# Patient Record
Sex: Male | Born: 1990 | Race: White | Hispanic: No | Marital: Single | Smoking: Current every day smoker
Health system: Southern US, Community
[De-identification: ages and names within clinical notes are randomized; demographics above are authoritative.]

## PROBLEM LIST (undated history)

## (undated) DIAGNOSIS — F988 Other specified behavioral and emotional disorders with onset usually occurring in childhood and adolescence: Secondary | ICD-10-CM

## (undated) DIAGNOSIS — K859 Acute pancreatitis without necrosis or infection, unspecified: Secondary | ICD-10-CM

## (undated) DIAGNOSIS — E119 Type 2 diabetes mellitus without complications: Secondary | ICD-10-CM

## (undated) DIAGNOSIS — F319 Bipolar disorder, unspecified: Secondary | ICD-10-CM

## (undated) HISTORY — PX: FINGER SURGERY: SHX640

## (undated) HISTORY — PX: OTHER SURGICAL HISTORY: SHX169

---
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
## 2015-07-02 ENCOUNTER — Emergency Department (HOSPITAL_BASED_OUTPATIENT_CLINIC_OR_DEPARTMENT_OTHER): Payer: Managed Care, Other (non HMO)

## 2015-07-02 ENCOUNTER — Emergency Department (HOSPITAL_BASED_OUTPATIENT_CLINIC_OR_DEPARTMENT_OTHER)
Admission: EM | Admit: 2015-07-02 | Discharge: 2015-07-02 | Disposition: A | Discharge status: Home / Self Care | Payer: Managed Care, Other (non HMO) | Attending: Emergency Medicine | Admitting: Emergency Medicine

## 2015-07-02 ENCOUNTER — Encounter (HOSPITAL_BASED_OUTPATIENT_CLINIC_OR_DEPARTMENT_OTHER): Payer: Self-pay | Admitting: *Deleted

## 2015-07-02 DIAGNOSIS — Y998 Other external cause status: Secondary | ICD-10-CM | POA: Insufficient documentation

## 2015-07-02 DIAGNOSIS — T50905A Adverse effect of unspecified drugs, medicaments and biological substances, initial encounter: Secondary | ICD-10-CM | POA: Insufficient documentation

## 2015-07-02 DIAGNOSIS — R21 Rash and other nonspecific skin eruption: Secondary | ICD-10-CM | POA: Diagnosis not present

## 2015-07-02 DIAGNOSIS — Z79899 Other long term (current) drug therapy: Secondary | ICD-10-CM | POA: Insufficient documentation

## 2015-07-02 DIAGNOSIS — Y9289 Other specified places as the place of occurrence of the external cause: Secondary | ICD-10-CM | POA: Insufficient documentation

## 2015-07-02 DIAGNOSIS — W228XXA Striking against or struck by other objects, initial encounter: Secondary | ICD-10-CM | POA: Insufficient documentation

## 2015-07-02 DIAGNOSIS — Y9389 Activity, other specified: Secondary | ICD-10-CM | POA: Diagnosis not present

## 2015-07-02 DIAGNOSIS — R739 Hyperglycemia, unspecified: Secondary | ICD-10-CM

## 2015-07-02 DIAGNOSIS — F1310 Sedative, hypnotic or anxiolytic abuse, uncomplicated: Secondary | ICD-10-CM | POA: Diagnosis not present

## 2015-07-02 DIAGNOSIS — E1165 Type 2 diabetes mellitus with hyperglycemia: Secondary | ICD-10-CM | POA: Insufficient documentation

## 2015-07-02 DIAGNOSIS — F17210 Nicotine dependence, cigarettes, uncomplicated: Secondary | ICD-10-CM | POA: Diagnosis not present

## 2015-07-02 DIAGNOSIS — F909 Attention-deficit hyperactivity disorder, unspecified type: Secondary | ICD-10-CM | POA: Insufficient documentation

## 2015-07-02 DIAGNOSIS — F1210 Cannabis abuse, uncomplicated: Secondary | ICD-10-CM | POA: Diagnosis not present

## 2015-07-02 DIAGNOSIS — Z7984 Long term (current) use of oral hypoglycemic drugs: Secondary | ICD-10-CM | POA: Insufficient documentation

## 2015-07-02 DIAGNOSIS — W231XXA Caught, crushed, jammed, or pinched between stationary objects, initial encounter: Secondary | ICD-10-CM | POA: Diagnosis not present

## 2015-07-02 DIAGNOSIS — S6291XA Unspecified fracture of right wrist and hand, initial encounter for closed fracture: Secondary | ICD-10-CM

## 2015-07-02 DIAGNOSIS — S99921A Unspecified injury of right foot, initial encounter: Secondary | ICD-10-CM | POA: Diagnosis not present

## 2015-07-02 DIAGNOSIS — S62346A Nondisplaced fracture of base of fifth metacarpal bone, right hand, initial encounter for closed fracture: Secondary | ICD-10-CM | POA: Diagnosis not present

## 2015-07-02 DIAGNOSIS — Z8659 Personal history of other mental and behavioral disorders: Secondary | ICD-10-CM | POA: Insufficient documentation

## 2015-07-02 DIAGNOSIS — S6991XA Unspecified injury of right wrist, hand and finger(s), initial encounter: Secondary | ICD-10-CM | POA: Diagnosis present

## 2015-07-02 HISTORY — DX: Type 2 diabetes mellitus without complications: E11.9

## 2015-07-02 LAB — CBC WITH DIFFERENTIAL/PLATELET
BASOS ABS: 0.1 10*3/uL (ref 0.0–0.1)
BASOS PCT: 1 %
Eosinophils Absolute: 0.1 10*3/uL (ref 0.0–0.7)
Eosinophils Relative: 1 %
HEMATOCRIT: 47.8 % (ref 39.0–52.0)
HEMOGLOBIN: 17 g/dL (ref 13.0–17.0)
Lymphocytes Relative: 23 %
Lymphs Abs: 2.8 10*3/uL (ref 0.7–4.0)
MCH: 30.8 pg (ref 26.0–34.0)
MCHC: 35.6 g/dL (ref 30.0–36.0)
MCV: 86.6 fL (ref 78.0–100.0)
Monocytes Absolute: 1.1 10*3/uL — ABNORMAL HIGH (ref 0.1–1.0)
Monocytes Relative: 9 %
NEUTROS ABS: 8.4 10*3/uL — AB (ref 1.7–7.7)
NEUTROS PCT: 66 %
PLATELETS: 360 10*3/uL (ref 150–400)
RBC: 5.52 MIL/uL (ref 4.22–5.81)
RDW: 12.6 % (ref 11.5–15.5)
WBC: 12.5 10*3/uL — AB (ref 4.0–10.5)

## 2015-07-02 LAB — BASIC METABOLIC PANEL
ANION GAP: 14 (ref 5–15)
BUN: 9 mg/dL (ref 6–20)
CALCIUM: 9.3 mg/dL (ref 8.9–10.3)
CO2: 21 mmol/L — ABNORMAL LOW (ref 22–32)
Chloride: 106 mmol/L (ref 101–111)
Creatinine, Ser: 0.68 mg/dL (ref 0.61–1.24)
Glucose, Bld: 245 mg/dL — ABNORMAL HIGH (ref 65–99)
Potassium: 3.9 mmol/L (ref 3.5–5.1)
Sodium: 141 mmol/L (ref 135–145)

## 2015-07-02 LAB — URINE MICROSCOPIC-ADD ON

## 2015-07-02 LAB — URINALYSIS, ROUTINE W REFLEX MICROSCOPIC
BILIRUBIN URINE: NEGATIVE
Glucose, UA: >1000 mg/dL — AB
Ketones, ur: NEGATIVE mg/dL
Leukocytes, UA: NEGATIVE
Nitrite: NEGATIVE
PROTEIN: 100 mg/dL — AB
Specific Gravity, Urine: 1.016 (ref 1.005–1.030)
pH: 6 (ref 5.0–8.0)

## 2015-07-02 LAB — RAPID URINE DRUG SCREEN, HOSP PERFORMED
AMPHETAMINES: POSITIVE — AB
Barbiturates: NOT DETECTED
Benzodiazepines: NOT DETECTED
Cocaine: NOT DETECTED
OPIATES: NOT DETECTED
Tetrahydrocannabinol: POSITIVE — AB

## 2015-07-02 LAB — CBG MONITORING, ED: Glucose-Capillary: 160 mg/dL — ABNORMAL HIGH (ref 65–99)

## 2015-07-02 MED ORDER — HYDROCODONE-ACETAMINOPHEN 5-325 MG PO TABS: 2.0000 | ORAL_TABLET | ORAL | Status: DC | PRN

## 2015-07-02 MED ORDER — SODIUM CHLORIDE 0.9 % IV BOLUS (SEPSIS)
1000.0000 mL | Freq: Once | INTRAVENOUS | Status: AC
  Administered 2015-07-02: 1000 mL via INTRAVENOUS

## 2015-07-02 MED ORDER — ONDANSETRON HCL 4 MG/2ML IJ SOLN
4.0000 mg | Freq: Once | INTRAMUSCULAR | Status: AC
  Administered 2015-07-02: 4 mg via INTRAVENOUS

## 2015-07-02 MED ORDER — ONDANSETRON HCL 4 MG/2ML IJ SOLN
INTRAMUSCULAR | Status: AC
  Administered 2015-07-02: 4 mg via INTRAVENOUS
  Filled 2015-07-02: qty 2

## 2015-07-02 MED ORDER — FENTANYL CITRATE (PF) 100 MCG/2ML IJ SOLN
50.0000 ug | Freq: Once | INTRAMUSCULAR | Status: AC
  Administered 2015-07-02: 50 ug via INTRAVENOUS
  Filled 2015-07-02: qty 2

## 2015-07-02 NOTE — ED Notes (Signed)
Dr. Beaton in room with patient now. 
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
## 2015-07-02 NOTE — Discharge Instructions (Signed)
Hyperglycemia °Hyperglycemia occurs when the glucose (sugar) in your blood is too high. Hyperglycemia can happen for many reasons, but it most often happens to people who do not know they have diabetes or are not managing their diabetes properly.  °CAUSES  °Whether you have diabetes or not, there are other causes of hyperglycemia. Hyperglycemia can occur when you have diabetes, but it can also occur in other situations that you might not be as aware of, such as: °Diabetes °· If you have diabetes and are having problems controlling your blood glucose, hyperglycemia could occur because of some of the following reasons: °¨ Not following your meal plan. °¨ Not taking your diabetes medications or not taking it properly. °¨ Exercising less or doing less activity than you normally do. °¨ Being sick. °Pre-diabetes °· This cannot be ignored. Before people develop Type 2 diabetes, they almost always have "pre-diabetes." This is when your blood glucose levels are higher than normal, but not yet high enough to be diagnosed as diabetes. Research has shown that some long-term damage to the body, especially the heart and circulatory system, may already be occurring during pre-diabetes. If you take action to manage your blood glucose when you have pre-diabetes, you may delay or prevent Type 2 diabetes from developing. °Stress °· If you have diabetes, you may be "diet" controlled or on oral medications or insulin to control your diabetes. However, you may find that your blood glucose is higher than usual in the hospital whether you have diabetes or not. This is often referred to as "stress hyperglycemia." Stress can elevate your blood glucose. This happens because of hormones put out by the body during times of stress. If stress has been the cause of your high blood glucose, it can be followed regularly by your caregiver. That way he/she can make sure your hyperglycemia does not continue to get worse or progress to  diabetes. °Steroids °· Steroids are medications that act on the infection fighting system (immune system) to block inflammation or infection. One side effect can be a rise in blood glucose. Most people can produce enough extra insulin to allow for this rise, but for those who cannot, steroids make blood glucose levels go even higher. It is not unusual for steroid treatments to "uncover" diabetes that is developing. It is not always possible to determine if the hyperglycemia will go away after the steroids are stopped. A special blood test called an A1c is sometimes done to determine if your blood glucose was elevated before the steroids were started. °SYMPTOMS °· Thirsty. °· Frequent urination. °· Dry mouth. °· Blurred vision. °· Tired or fatigue. °· Weakness. °· Sleepy. °· Tingling in feet or leg. °DIAGNOSIS  °Diagnosis is made by monitoring blood glucose in one or all of the following ways: °· A1c test. This is a chemical found in your blood. °· Fingerstick blood glucose monitoring. °· Laboratory results. °TREATMENT  °First, knowing the cause of the hyperglycemia is important before the hyperglycemia can be treated. Treatment may include, but is not be limited to: °· Education. °· Change or adjustment in medications. °· Change or adjustment in meal plan. °· Treatment for an illness, infection, etc. °· More frequent blood glucose monitoring. °· Change in exercise plan. °· Decreasing or stopping steroids. °· Lifestyle changes. °HOME CARE INSTRUCTIONS  °· Test your blood glucose as directed. °· Exercise regularly. Your caregiver will give you instructions about exercise. Pre-diabetes or diabetes which comes on with stress is helped by exercising. °· Eat wholesome,   balanced meals. Eat often and at regular, fixed times. Your caregiver or nutritionist will give you a meal plan to guide your sugar intake.  Being at an ideal weight is important. If needed, losing as little as 10 to 15 pounds may help improve blood  glucose levels. SEEK MEDICAL CARE IF:   You have questions about medicine, activity, or diet.  You continue to have symptoms (problems such as increased thirst, urination, or weight gain). SEEK IMMEDIATE MEDICAL CARE IF:   You are vomiting or have diarrhea.  Your breath smells fruity.  You are breathing faster or slower.  You are very sleepy or incoherent.  You have numbness, tingling, or pain in your feet or hands.  You have chest pain.  Your symptoms get worse even though you have been following your caregiver's orders.  If you have any other questions or concerns.   This information is not intended to replace advice given to you by your health care provider. Make sure you discuss any questions you have with your health care provider.   Document Released: 09/19/2000 Document Revised: 06/18/2011 Document Reviewed: 11/30/2014 Elsevier Interactive Patient Education 2016 [employer]  Cast or Splint Care Casts and splints support injured limbs and keep bones from moving while they heal.  HOME CARE  Keep the cast or splint uncovered during the drying period.  A plaster cast can take 24 to 48 hours to dry.  A fiberglass cast will dry in less than 1 hour.  Do not rest the cast on anything harder than a pillow for 24 hours.  Do not put weight on your injured limb. Do not put pressure on the cast. Wait for your doctor's approval.  Keep the cast or splint dry.  Cover the cast or splint with a plastic bag during baths or wet weather.  If you have a cast over your chest and belly (trunk), take sponge baths until the cast is taken off.  If your cast gets wet, dry it with a towel or blow dryer. Use the cool setting on the blow dryer.  Keep your cast or splint clean. Wash a dirty cast with a damp cloth.  Do not put any objects under your cast or splint.  Do not scratch the skin under the cast with an object. If itching is a problem, use a blow dryer on a cool setting over  the itchy area.  Do not trim or cut your cast.  Do not take out the padding from inside your cast.  Exercise your joints near the cast as told by your doctor.  Raise (elevate) your injured limb on 1 or 2 pillows for the first 1 to 3 days. GET HELP IF:  Your cast or splint cracks.  Your cast or splint is too tight or too loose.  You itch badly under the cast.  Your cast gets wet or has a soft spot.  You have a bad smell coming from the cast.  You get an object stuck under the cast.  Your skin around the cast becomes red or sore.  You have new or more pain after the cast is put on. GET HELP RIGHT AWAY IF:  You have fluid leaking through the cast.  You cannot move your fingers or toes.  Your fingers or toes turn blue or white or are cool, painful, or puffy (swollen).  You have tingling or lose feeling (numbness) around the injured area.  You have bad pain or pressure under the cast.  You have trouble breathing or have shortness of breath.  You have chest pain.   This information is not intended to replace advice given to you by your health care provider. Make sure you discuss any questions you have with your health care provider.   Document Released: 07/26/2010 Document Revised: 11/26/2012 Document Reviewed: 10/02/2012 Elsevier Interactive Patient Education [employer]

## 2015-07-02 NOTE — ED Notes (Signed)
Splint removed per RN Horton's orders.
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
## 2015-07-02 NOTE — ED Notes (Signed)
[facility] and family about results and plan. Pt alert, NAD, calm, interactive.

## 2015-07-02 NOTE — ED Notes (Signed)
CMS intact before and after. Pt understood and tolerated the procedure well. Pt and pt's mother stated they did not have any questions.

## 2015-07-07 ENCOUNTER — Ambulatory Visit (INDEPENDENT_AMBULATORY_CARE_PROVIDER_SITE_OTHER): Payer: Managed Care, Other (non HMO) | Admitting: Family Medicine

## 2015-07-07 ENCOUNTER — Encounter: Payer: Self-pay | Admitting: Family Medicine

## 2015-07-07 VITALS — BP 131/87 | HR 87 | Ht 70.0 in | Wt 180.0 lb

## 2015-07-07 DIAGNOSIS — S62306A Unspecified fracture of fifth metacarpal bone, right hand, initial encounter for closed fracture: Secondary | ICD-10-CM | POA: Diagnosis not present

## 2015-07-07 MED ORDER — OXYCODONE-ACETAMINOPHEN 7.5-325 MG PO TABS: 1.0000 | ORAL_TABLET | Freq: Four times a day (QID) | ORAL | Status: DC | PRN
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
## 2015-07-07 NOTE — Patient Instructions (Signed)
You have a fracture at the base of your 5th metacarpal (pinky finger). These are at high risk of not healing properly and often require surgery. Wear the splint at all times. Take percocet as needed for severe pain. Elevate above the level of your heart as much as possible. We are referring you to a hand surgeon.

## 2015-07-07 NOTE — Assessment & Plan Note (Signed)
intraarticular at base of 5th metacarpal.  Independently reviewed these radiographs.  We discussed unstable nature of these fractures especially with this being intraarticular and high incidence of not healing without surgical intervention.  Recommending moving ahead with referral to hand surgery - will see them tomorrow.  I recommended ulnar gutter splint - patient did not want this splint - same one he was placed in initially and was very painful.  Opted instead for dorsal and volar splints which were placed today.  Elevation, percocet as needed.

## 2015-07-08 NOTE — Addendum Note (Signed)
Addended by: Kathi SimpersWISE, Khaidyn Staebell F on: 07/08/2015 08:07 AM   Modules accepted: Orders

## 2015-10-18 ENCOUNTER — Encounter: Payer: Self-pay | Admitting: Internal Medicine

## 2015-12-28 ENCOUNTER — Encounter: Payer: Self-pay | Admitting: Endocrinology

## 2016-02-13 ENCOUNTER — Ambulatory Visit (INDEPENDENT_AMBULATORY_CARE_PROVIDER_SITE_OTHER): Payer: Managed Care, Other (non HMO) | Admitting: Endocrinology

## 2016-02-13 ENCOUNTER — Encounter: Payer: Self-pay | Admitting: Endocrinology

## 2016-02-13 DIAGNOSIS — E119 Type 2 diabetes mellitus without complications: Secondary | ICD-10-CM

## 2016-02-13 MED ORDER — NICOTINE 21 MG/24HR TD PT24: 21.0000 mg | MEDICATED_PATCH | Freq: Every day | TRANSDERMAL | 11 refills | Status: AC

## 2016-02-13 MED ORDER — INSULIN ASPART 100 UNIT/ML FLEXPEN: 12.0000 [IU] | PEN_INJECTOR | Freq: Three times a day (TID) | SUBCUTANEOUS | 11 refills | Status: DC

## 2016-02-13 MED ORDER — INSULIN DEGLUDEC 100 UNIT/ML ~~LOC~~ SOPN: 32.0000 [IU] | PEN_INJECTOR | Freq: Every day | SUBCUTANEOUS | 11 refills | Status: DC

## 2016-02-13 NOTE — Patient Instructions (Addendum)
good diet and exercise significantly improve the control of your diabetes.  please let me know if you wish to be referred to a dietician.  high blood sugar is very risky to your health.  you should see an eye doctor and dentist every year.  It is very important to get all recommended vaccinations.  Controlling your blood pressure and cholesterol drastically reduces the damage diabetes does to your body.  Those who smoke should quit.  Please discuss these with your doctor.  Consuming alcohol and drugs works against your efforts to keep your blood sugar is a good range.  check your blood sugar 4 times a day: before the 3 meals, and at bedtime.  also check if you have symptoms of your blood sugar being too high or too low.  please keep a record of the readings and bring it to your next appointment here (or you can bring the meter itself).  You can write it on any piece of paper.  please call us sooner if your blood sugar goes below 70, or if you have a lot of readings over 200.   For now, please:  Decrease the tresiba to 32 units daily, and:  Increase the novolog to 12 units 3 times a day (just before each meal), no matter what your blood sugar is. Please come back for a follow-up appointment in 1 month.

## 2016-02-13 NOTE — Progress Notes (Signed)
Subjective:    Patient ID: [name] Wall, male    DOB: [birth year], 25 y.o.   MRN: [id number]  HPI pt states DM was dx'ed in 2015; he has mild if any neuropathy of the lower extremities; he is unaware of any associated chronic complications; he has been on insulin since soon after dx; pt says his diet and exercise are "fair."  he had an episode of pancreatitis in mid-2017.  rx of DM has been limited by noncompliance, alcoholism, and marijuana use.  Last episode of severe hypoglycemia was in early 2017.  Only episode of DKA was at time of dx.   He takes tresiba (which he seldom misses), and PRN novolog (usually TID--averages 8-12 units per meal).  He says cbg's vary widely.  He has mild hypoglycemia approx once per week, but most cbg's are over 200.  It is in general higher as the day goes on.  He works at [employer], variable shifts.   Past Medical History:  Diagnosis Date  . ADD (attention deficit disorder)   . Bipolar disorder (HCC)   . Diabetes mellitus without complication [facility](HCC)     Past Surgical History:  Procedure Laterality Date  . Arm surgery    . FINGER SURGERY      Social History   Social History  . Marital status: Single    Spouse name: N/A  . Number of children: N/A  . Years of education: N/A   Occupational History  . Not on file.   Social History Main Topics  . Smoking status: Current Every Day Smoker    Packs/day: 1.00    Types: Cigarettes  . Smokeless tobacco: Former NeurosurgeonUser  . Alcohol use 0.0 oz/week     Comment: case of beer/week  . Drug use: No  . Sexual activity: Not on file   Other Topics Concern  . Not on file   Social History Narrative  . No narrative on file    Current Outpatient Prescriptions on File Prior to Visit  Medication Sig Dispense Refill  . amphetamine-dextroamphetamine (ADDERALL) 30 MG tablet Take 30 mg by mouth 2 (two) times daily.       No current facility-administered medications on file prior to visit.     No Known  Allergies  Family History  Problem Relation Age of Onset  . Diabetes Father     BP 132/80   Pulse (!) 104   Ht 5\' 10"  (1.778 m)   Wt 193 lb (87.5 kg)   SpO2 97%   BMI 27.69 kg/m     Review of Systems denies weight loss, blurry vision, headache, chest pain, sob, n/v, muscle cramps, excessive diaphoresis, cold intolerance, rhinorrhea, and easy bruising.  He has frequent urination and depression.      Objective:   Physical Exam VS: see vs page GEN: no distress.  HEAD: head: no deformity eyes: no periorbital swelling, no proptosis external nose and ears are normal mouth: no lesion seen NECK: supple, thyroid is not enlarged.   CHEST WALL: no deformity LUNGS: clear to auscultation CV: reg rate and rhythm, no murmur ABD: abdomen is soft, nontender.  no hepatosplenomegaly.  not distended.  no hernia MUSCULOSKELETAL: muscle bulk and strength are grossly normal.  no obvious joint swelling.  gait is normal and steady.   EXTEMITIES: no deformity.  no ulcer on the feet.  feet are of normal color and temp.  no edema PULSES: dorsalis pedis intact bilat.  no carotid bruit NEURO:  cn 2-12 grossly  intact.   readily moves all 4's.  sensation is intact to touch on the feet SKIN:  Normal texture and temperature.  No rash or suspicious lesion is visible.   NODES:  None palpable at the neck PSYCH: alert, well-oriented.  Does not appear anxious nor depressed.   A1c=9.1% (pt's mother says it was the same last month)  I have reviewed outside records, and summarized: Pt was seen in ER for hand fracture, and acute alcoholism.   He was also seen in ER for acute pancreatitis.       Assessment & Plan:  Type 1 DM: he needs increased rx.   Bipolar disorder, new to me: we discussed the fact that it is essential to control this in order to achieve good glycemic control. Mother says he can't go to psychiatry, due to $3000 deductible.  Patient is advised the following: Patient Instructions  good  diet and exercise significantly improve the control of your diabetes.  please let me know if you wish to be referred to a dietician.  high blood sugar is very risky to your health.  you should see an eye doctor and dentist every year.  It is very important to get all recommended vaccinations.  Controlling your blood pressure and cholesterol drastically reduces the damage diabetes does to your body.  Those who smoke should quit.  Please discuss these with your doctor.  Consuming alcohol and drugs works against your efforts to keep your blood sugar is a good range.  check your blood sugar 4 times a day: before the 3 meals, and at bedtime.  also check if you have symptoms of your blood sugar being too high or too low.  please keep a record of the readings and bring it to your next appointment here (or you can bring the meter itself).  You can write it on any piece of paper.  please call us sooner if your blood sugar goes below 70, or if you have a lot of readings over 200.   For now, please:  Decrease the tresiba to 32 units daily, and:  Increase the novolog to 12 units 3 times a day (just before each meal), no matter what your blood sugar is. Please come back for a follow-up appointment in 1 month.

## 2016-02-14 ENCOUNTER — Encounter: Payer: Self-pay | Admitting: Endocrinology

## 2016-02-14 DIAGNOSIS — E119 Type 2 diabetes mellitus without complications: Secondary | ICD-10-CM | POA: Insufficient documentation

## 2016-03-14 ENCOUNTER — Ambulatory Visit (INDEPENDENT_AMBULATORY_CARE_PROVIDER_SITE_OTHER): Payer: Managed Care, Other (non HMO) | Admitting: Endocrinology

## 2016-03-14 ENCOUNTER — Encounter: Payer: Self-pay | Admitting: Endocrinology

## 2016-03-14 VITALS — BP 123/90 | HR 70 | Wt 198.0 lb

## 2016-03-14 DIAGNOSIS — E119 Type 2 diabetes mellitus without complications: Secondary | ICD-10-CM | POA: Diagnosis not present

## 2016-03-14 LAB — POCT GLYCOSYLATED HEMOGLOBIN (HGB A1C): Hemoglobin A1C: 10.1

## 2016-03-14 MED ORDER — INSULIN DEGLUDEC 100 UNIT/ML ~~LOC~~ SOPN: 50.0000 [IU] | PEN_INJECTOR | Freq: Every day | SUBCUTANEOUS | 11 refills | Status: DC

## 2016-03-14 MED ORDER — INSULIN ASPART 100 UNIT/ML FLEXPEN: 6.0000 [IU] | PEN_INJECTOR | Freq: Three times a day (TID) | SUBCUTANEOUS | 11 refills | Status: DC

## 2016-03-14 NOTE — Progress Notes (Signed)
Subjective:    Patient ID: [name] GianottiMatthew R Kirchner, male    DOB: 12-05-1990, 25 y.o.   MRN: [id number]  HPI Pt returns for f/u of diabetes mellitus:  DM type: 1 Dx'ed: 2015 Complications: none Therapy: insulin since  DKA: only at time of rx Severe hypoglycemia: last episode was early 2017.   Pancreatitis: never Other: rx has been limited by noncompliance, alcoholism, marijuana use, and bipolar disorder (pt says he cannot afford psychiatry copay); he works at [employer], variable shifts; he takes multiple daily injections.  Interval history: no cbg record, but states cbg's vary widely (80-500).  There is no trend throughout the day.  He says he never misses the insulin.   Past Medical History:  Diagnosis Date  . ADD (attention deficit disorder)   . Bipolar disorder (HCC)   . Diabetes mellitus without complication [facility](HCC)     Past Surgical History:  Procedure Laterality Date  . Arm surgery    . FINGER SURGERY      Social History   Social History  . Marital status: Single    Spouse name: N/A  . Number of children: N/A  . Years of education: N/A   Occupational History  . Not on file.   Social History Main Topics  . Smoking status: Current Every Day Smoker    Packs/day: 1.00    Types: Cigarettes  . Smokeless tobacco: Former NeurosurgeonUser  . Alcohol use 0.0 oz/week     Comment: case of beer/week  . Drug use: No  . Sexual activity: Not on file   Other Topics Concern  . Not on file   Social History Narrative  . No narrative on file    Current Outpatient Prescriptions on File Prior to Visit  Medication Sig Dispense Refill  . amphetamine-dextroamphetamine (ADDERALL) 30 MG tablet Take 30 mg by mouth 2 (two) times daily.      . nicotine (NICODERM CQ - DOSED IN MG/24 HOURS) 21 mg/24hr patch Place 1 patch (21 mg total) onto the skin daily. 28 patch 11  . sertraline (ZOLOFT) 100 MG tablet Take 100 mg by mouth daily.     No current facility-administered medications on file prior to  visit.     No Known Allergies  Family History  Problem Relation Age of Onset  . Diabetes Father     BP 123/90   Pulse 70   Wt 198 lb (89.8 kg)   BMI 28.41 kg/m    Review of Systems He denies hypoglycemia.      Objective:   Physical Exam VITAL SIGNS:  See vs page GENERAL: no distress Pulses: dorsalis pedis intact bilat.   MSK: no deformity of the feet CV: no leg edema.  Skin:  no ulcer on the feet.  normal color and temp on the feet. Neuro: sensation is intact to touch on the feet.     A1c=10.1%    Assessment & Plan:  Type 1 DM, worse.    Patient is advised the following: Patient Instructions  Please change your insulin to the numbers listed below. check your blood sugar 4 times a day: before the 3 meals, and at bedtime.  also check if you have symptoms of your blood sugar being too high or too low.  please keep a record of the readings and bring it to your next appointment here (or you can bring the meter itself).  You can write it on any piece of paper.  please call us sooner if your blood sugar goes  below 70, or if you have a lot of readings over 200.   Please come back for a follow-up appointment in 2 months.

## 2016-03-14 NOTE — Patient Instructions (Addendum)
Please change your insulin to the numbers listed below.  check your blood sugar 4 times a day: before the 3 meals, and at bedtime.  also check if you have symptoms of your blood sugar being too high or too low.  please keep a record of the readings and bring it to your next appointment here (or you can bring the meter itself).  You can write it on any piece of paper.  please call us sooner if your blood sugar goes below 70, or if you have a lot of readings over 200.   Please come back for a follow-up appointment in 2 months.

## 2016-04-05 ENCOUNTER — Telehealth: Payer: Self-pay | Admitting: Endocrinology

## 2016-04-05 NOTE — Telephone Encounter (Signed)
°  Refill of   insulin degludec (TRESIBA FLEXTOUCH) 100 UNIT/ML SOPN FlexTouch Pen  Insurance will no longer cover the insulin aspart (NOVOLOG FLEXPEN) 100 UNIT/ML FlexPen  Please advise

## 2016-05-15 ENCOUNTER — Ambulatory Visit: Payer: Managed Care, Other (non HMO) | Admitting: Endocrinology

## 2016-06-29 ENCOUNTER — Emergency Department (HOSPITAL_COMMUNITY): Payer: 59

## 2016-06-29 ENCOUNTER — Encounter (HOSPITAL_COMMUNITY): Payer: Self-pay | Admitting: Emergency Medicine

## 2016-06-29 ENCOUNTER — Emergency Department (HOSPITAL_COMMUNITY)
Admission: EM | Admit: 2016-06-29 | Discharge: 2016-06-30 | Disposition: A | Discharge status: Home / Self Care | Payer: 59 | Attending: Emergency Medicine | Admitting: Emergency Medicine

## 2016-06-29 DIAGNOSIS — Y999 Unspecified external cause status: Secondary | ICD-10-CM | POA: Insufficient documentation

## 2016-06-29 DIAGNOSIS — Y9389 Activity, other specified: Secondary | ICD-10-CM | POA: Insufficient documentation

## 2016-06-29 DIAGNOSIS — F17210 Nicotine dependence, cigarettes, uncomplicated: Secondary | ICD-10-CM | POA: Diagnosis not present

## 2016-06-29 DIAGNOSIS — Z79899 Other long term (current) drug therapy: Secondary | ICD-10-CM | POA: Diagnosis not present

## 2016-06-29 DIAGNOSIS — E1165 Type 2 diabetes mellitus with hyperglycemia: Secondary | ICD-10-CM | POA: Insufficient documentation

## 2016-06-29 DIAGNOSIS — S0990XA Unspecified injury of head, initial encounter: Secondary | ICD-10-CM | POA: Insufficient documentation

## 2016-06-29 DIAGNOSIS — F10920 Alcohol use, unspecified with intoxication, uncomplicated: Secondary | ICD-10-CM

## 2016-06-29 DIAGNOSIS — Z794 Long term (current) use of insulin: Secondary | ICD-10-CM | POA: Insufficient documentation

## 2016-06-29 DIAGNOSIS — W228XXA Striking against or struck by other objects, initial encounter: Secondary | ICD-10-CM | POA: Insufficient documentation

## 2016-06-29 DIAGNOSIS — Y9289 Other specified places as the place of occurrence of the external cause: Secondary | ICD-10-CM | POA: Insufficient documentation

## 2016-06-29 DIAGNOSIS — R739 Hyperglycemia, unspecified: Secondary | ICD-10-CM

## 2016-06-29 DIAGNOSIS — F10120 Alcohol abuse with intoxication, uncomplicated: Secondary | ICD-10-CM | POA: Insufficient documentation

## 2016-06-29 LAB — I-STAT CHEM 8, ED
BUN: 8 mg/dL (ref 6–20)
CHLORIDE: 109 mmol/L (ref 101–111)
Calcium, Ion: 0.8 mmol/L — CL (ref 1.15–1.40)
Creatinine, Ser: 1.1 mg/dL (ref 0.61–1.24)
Glucose, Bld: 381 mg/dL — ABNORMAL HIGH (ref 65–99)
HEMATOCRIT: 49 % (ref 39.0–52.0)
Hemoglobin: 16.7 g/dL (ref 13.0–17.0)
Potassium: 4.5 mmol/L (ref 3.5–5.1)
SODIUM: 139 mmol/L (ref 135–145)
TCO2: 14 mmol/L (ref 0–100)

## 2016-06-29 LAB — BLOOD GAS, VENOUS
Acid-base deficit: 4.4 mmol/L — ABNORMAL HIGH (ref 0.0–2.0)
Bicarbonate: 22.2 mmol/L (ref 20.0–28.0)
Drawn by: 23298
O2 Saturation: 66.7 %
PCO2 VEN: 48.2 mmHg (ref 44.0–60.0)
PO2 VEN: 41.2 mmHg (ref 32.0–45.0)
Patient temperature: 98.6
pH, Ven: 7.285 (ref 7.250–7.430)

## 2016-06-29 MED ORDER — ONDANSETRON HCL 4 MG/2ML IJ SOLN
4.0000 mg | Freq: Once | INTRAMUSCULAR | Status: AC
  Administered 2016-06-29: 4 mg via INTRAVENOUS
  Filled 2016-06-29: qty 2

## 2016-06-29 MED ORDER — SODIUM CHLORIDE 0.9 % IV BOLUS (SEPSIS)
1000.0000 mL | Freq: Once | INTRAVENOUS | Status: AC
  Administered 2016-06-29: 1000 mL via INTRAVENOUS

## 2016-06-29 MED ORDER — FAMOTIDINE IN NACL 20-0.9 MG/50ML-% IV SOLN
20.0000 mg | Freq: Once | INTRAVENOUS | Status: AC
  Administered 2016-06-29: 20 mg via INTRAVENOUS
  Filled 2016-06-29: qty 50

## 2016-06-29 MED ORDER — LORAZEPAM 2 MG/ML IJ SOLN
0.5000 mg | Freq: Once | INTRAMUSCULAR | Status: AC
  Administered 2016-06-29: 0.5 mg via INTRAVENOUS
  Filled 2016-06-29: qty 1

## 2016-06-29 NOTE — ED Notes (Signed)
Bed: [id number] Expected date:  Expected time:  Means of arrival:  Comments: ETOH, 26 yr old, combative

## 2016-06-29 NOTE — ED Triage Notes (Signed)
Brought in by EMS from home with c/o alcohol intoxicationn.  Pt was seen by a neighbor sleeping on the ground by his parent's front yard.  Pt insisted to be taken to ED "because he is cold and wet".  Pt appears heavily intoxicated.

## 2016-06-29 NOTE — ED Provider Notes (Signed)
[facility]-EMERGENCY DEPT Provider Note   CSN: [id number] Arrival date & time: 06/29/16  2126    History   Chief Complaint Chief Complaint  Patient presents with  . Alcohol Intoxication    Level 5 caveat secondary to alcohol intoxication  HPI [name] Wall is a 26 y.o. male.  26 year old male presents to the emergency department for evaluation of alcohol intoxication. He has a history of ADD, bipolar disorder, and diabetes mellitus. EMS report hyperglycemia with CBG of 338 en route. Patient will expand on what he drank or when he began drinking today. He was found in the grass on the front lawn of his parents home. He expressed to EMS that he desired to go to the hospital. He has no complaints of pain. Moving all extremities.   The history is provided by the EMS personnel.  Alcohol Intoxication     Past Medical History:  Diagnosis Date  . ADD (attention deficit disorder)   . Bipolar disorder (HCC)   . Diabetes mellitus without complication [facility])     Patient Active Problem List   Diagnosis Date Noted  . Diabetes mellitus without complication (HCC) 02/14/2016  . Fracture of fifth metacarpal bone of right hand 07/07/2015    Past Surgical History:  Procedure Laterality Date  . Arm surgery    . FINGER SURGERY         Home Medications    Prior to Admission medications   Medication Sig Start Date End Date Taking? Authorizing Provider  amphetamine-dextroamphetamine (ADDERALL) 30 MG tablet Take 30 mg by mouth 2 (two) times daily.      Historical Provider, MD  insulin aspart (NOVOLOG FLEXPEN) 100 UNIT/ML FlexPen Inject 6 Units into the skin 3 (three) times daily with meals. And pen needles 4/day 03/14/16   Romero Belling, MD  insulin degludec (TRESIBA FLEXTOUCH) 100 UNIT/ML SOPN FlexTouch Pen Inject 0.5 mLs (50 Units total) into the skin daily. 03/14/16   Romero Belling, MD  nicotine (NICODERM CQ - DOSED IN MG/24 HOURS) 21 mg/24hr patch Place 1 patch (21 mg total) onto the  skin daily. 02/13/16   Romero Belling, MD  sertraline (ZOLOFT) 100 MG tablet Take 100 mg by mouth daily.    Historical Provider, MD    Family History Family History  Problem Relation Age of Onset  . Diabetes Father     Social History Social History  Substance Use Topics  . Smoking status: Current Every Day Smoker    Packs/day: 1.00    Types: Cigarettes  . Smokeless tobacco: Former Neurosurgeon  . Alcohol use 0.0 oz/week     Comment: case of beer/week     Allergies   Patient has no known allergies.   Review of Systems Review of Systems  Unable to perform ROS: Other  Level 5 caveat 2/2 intoxication   Physical Exam Updated Vital Signs BP 105/62   Pulse 72   Resp 17   SpO2 97%   Physical Exam  Constitutional: He appears well-developed and well-nourished. No distress.  Nontoxic and in NAD. Smells of ETOH.  HENT:  Head: Normocephalic and atraumatic.  Mouth/Throat: Oropharynx is clear and moist.  Eyes: Conjunctivae and EOM are normal. Pupils are equal, round, and reactive to light. No scleral icterus.  Neck: Normal range of motion.  Cardiovascular: Regular rhythm and intact distal pulses.   Tachycardia  Pulmonary/Chest: Effort normal. No respiratory distress. He has no wheezes. He has no rales.  Blue bandaids on bilateral nipples. Respirations even and unlabored  Musculoskeletal:  Normal range of motion.  Neurological: He is alert.  Patient will answer some questions with "yes sir/ma'am". Will not answer other questions. Will randomly yell "yeah". Moving all extremities.  Skin: Skin is warm and dry. No rash noted. He is not diaphoretic. No erythema. No pallor.  Psychiatric: He has a normal mood and affect. His behavior is normal. His speech is slurred.  Nursing note and vitals reviewed.    ED Treatments / Results  Labs (all labs ordered are listed, but only abnormal results are displayed) Labs Reviewed  BLOOD GAS, VENOUS - Abnormal; Notable for the following:        Result Value   Acid-base deficit 4.4 (*)    All other components within normal limits  I-STAT CHEM 8, ED - Abnormal; Notable for the following:    Glucose, Bld 381 (*)    Calcium, Ion 0.80 (*)    All other components within normal limits  CBG MONITORING, ED - Abnormal; Notable for the following:    Glucose-Capillary 312 (*)    All other components within normal limits    EKG  EKG Interpretation None       Radiology Ct Head Wo Contrast  Result Date: 06/29/2016 CLINICAL DATA:  26 year old male with fall. EXAM: CT HEAD WITHOUT CONTRAST CT CERVICAL SPINE WITHOUT CONTRAST TECHNIQUE: Multidetector CT imaging of the head and cervical spine was performed following the standard protocol without intravenous contrast. Multiplanar CT image reconstructions of the cervical spine were also generated. COMPARISON:  Facial bone CT dated 02/18/2011 FINDINGS: CT HEAD FINDINGS Brain: No evidence of acute infarction, hemorrhage, hydrocephalus, extra-axial collection or mass lesion/mass effect. Vascular: No hyperdense vessel or unexpected calcification. Skull: Normal. Negative for fracture or focal lesion. Sinuses/Orbits: No acute finding. Other: None. CT CERVICAL SPINE FINDINGS Alignment: Normal. Skull base and vertebrae: No acute fracture. No primary bone lesion or focal pathologic process. Soft tissues and spinal canal: No prevertebral fluid or swelling. No visible canal hematoma. Disc levels:  No acute findings.  No degenerative changes. Upper chest: Negative. Other: An 11 mm focal nodular appearing area from the inferior pole of the left lobe of the thyroid gland (series 12, image 4). This may represent normal thyroid tissue versus a thyroid nodule. Ultrasound may provide better evaluation of thyroid gland. IMPRESSION: 1. No acute intracranial pathology. Normal unenhanced CT of the brain. 2. No acute/traumatic cervical spine pathology. 3. Small area of nodularity from the inferior pole of the left lobe of the  thyroid gland. Ultrasound may provide better evaluation of the thyroid. Electronically Signed   By: Elgie Collard M.D.   On: 06/29/2016 23:16   Ct Cervical Spine Wo Contrast  Result Date: 06/29/2016 CLINICAL DATA:  26 year old male with fall. EXAM: CT HEAD WITHOUT CONTRAST CT CERVICAL SPINE WITHOUT CONTRAST TECHNIQUE: Multidetector CT imaging of the head and cervical spine was performed following the standard protocol without intravenous contrast. Multiplanar CT image reconstructions of the cervical spine were also generated. COMPARISON:  Facial bone CT dated 02/18/2011 FINDINGS: CT HEAD FINDINGS Brain: No evidence of acute infarction, hemorrhage, hydrocephalus, extra-axial collection or mass lesion/mass effect. Vascular: No hyperdense vessel or unexpected calcification. Skull: Normal. Negative for fracture or focal lesion. Sinuses/Orbits: No acute finding. Other: None. CT CERVICAL SPINE FINDINGS Alignment: Normal. Skull base and vertebrae: No acute fracture. No primary bone lesion or focal pathologic process. Soft tissues and spinal canal: No prevertebral fluid or swelling. No visible canal hematoma. Disc levels:  No acute findings.  No degenerative changes. Upper  chest: Negative. Other: An 11 mm focal nodular appearing area from the inferior pole of the left lobe of the thyroid gland (series 12, image 4). This may represent normal thyroid tissue versus a thyroid nodule. Ultrasound may provide better evaluation of thyroid gland. IMPRESSION: 1. No acute intracranial pathology. Normal unenhanced CT of the brain. 2. No acute/traumatic cervical spine pathology. 3. Small area of nodularity from the inferior pole of the left lobe of the thyroid gland. Ultrasound may provide better evaluation of the thyroid. Electronically Signed   By: Elgie CollardArash  Radparvar M.D.   On: 06/29/2016 23:16    Procedures Procedures (including critical care time)  Medications Ordered in ED Medications  sodium chloride 0.9 % bolus 1,000  mL (0 mLs Intravenous Stopped 06/30/16 0212)  famotidine (PEPCID) IVPB 20 mg premix (0 mg Intravenous Stopped 06/29/16 2230)  ondansetron (ZOFRAN) injection 4 mg (4 mg Intravenous Given 06/29/16 2159)  LORazepam (ATIVAN) injection 0.5 mg (0.5 mg Intravenous Given 06/29/16 2223)  insulin aspart (novoLOG) injection 3 Units (3 Units Subcutaneous Given 06/30/16 0211)     Initial Impression / Assessment and Plan / ED Course  I have reviewed the triage vital signs and the nursing notes.  Pertinent labs & imaging results that were available during my care of the patient were reviewed by me and considered in my medical decision making (see chart for details).     10:36 PM Patient was seen by staff trying to get out of bed. Staff went to tend to patient but he was witnessed to have fallen, striking back of head on door. No LOC. Patient assisted back to bed. Will add on head CT. Patient pulled out IV in process. Will attempt to get sitter for safety.  11:25 PM CTs negative. Patient now sleeping. Will allow for further sobering.  2:19 AM Patient has been allowed to sober. He is now more awake with clear speech. He has had multiple glasses of water. Patient has also been able to ambulate without difficulty. I believe the patient is stable and appropriate for discharge. He is to notify his mother of his need for a ride home.   Final Clinical Impressions(s) / ED Diagnoses   Final diagnoses:  Alcoholic intoxication without complication (HCC)  Hyperglycemia    New Prescriptions New Prescriptions   No medications on file     Antony MaduraKelly Abeeha Twist, PA-C 06/30/16 0220    Benjiman CoreNathan Pickering, MD 07/02/16 1501

## 2016-06-30 LAB — CBG MONITORING, ED: Glucose-Capillary: 312 mg/dL — ABNORMAL HIGH (ref 65–99)

## 2016-06-30 MED ORDER — INSULIN ASPART 100 UNIT/ML ~~LOC~~ SOLN
3.0000 [IU] | Freq: Once | SUBCUTANEOUS | Status: AC
  Administered 2016-06-30: 3 [IU] via SUBCUTANEOUS
  Filled 2016-06-30: qty 1

## 2016-06-30 NOTE — ED Notes (Signed)
Pt's contact:  Burna MortimerWanda (mother)--- tel# [phone]

## 2016-06-30 NOTE — ED Notes (Signed)
Pt has had a fall at this time:  Pt got out of bed, walked towards the door, lost his balance and fell backwards, hitting head against door and landed on his buttocks.  Pt c/o headache after fall.  Tresa EndoKelly, NP and Victorino DikeJennifer, RN at bedside.  This primary RN assisted pt off the floor and assisted back to bed---- s/s apparent injury noted from fall noted.

## 2016-06-30 NOTE — ED Notes (Signed)
Pt's father here to pick up pt home.

## 2016-06-30 NOTE — ED Notes (Addendum)
Pt ambulated unassisted for 20 feet with a steady gait.

## 2016-06-30 NOTE — ED Notes (Signed)
Pt's mother, [name] Wall, was contacted and was made aware of pt's condition and current treatment.  Was also made aware of pt's fall and his CT head and neck results.

## 2017-01-03 ENCOUNTER — Emergency Department (HOSPITAL_COMMUNITY)
Admission: EM | Admit: 2017-01-03 | Discharge: 2017-01-03 | Disposition: A | Discharge status: Home / Self Care | Payer: 59 | Attending: Emergency Medicine | Admitting: Emergency Medicine

## 2017-01-03 ENCOUNTER — Encounter (HOSPITAL_COMMUNITY): Payer: Self-pay | Admitting: Emergency Medicine

## 2017-01-03 DIAGNOSIS — Z5321 Procedure and treatment not carried out due to patient leaving prior to being seen by health care provider: Secondary | ICD-10-CM | POA: Diagnosis not present

## 2017-01-03 DIAGNOSIS — R109 Unspecified abdominal pain: Secondary | ICD-10-CM | POA: Insufficient documentation

## 2017-01-03 HISTORY — DX: Acute pancreatitis without necrosis or infection, unspecified: K85.90

## 2017-01-03 NOTE — ED Triage Notes (Signed)
Pt states he is in [facility] and had a relapse yesterday  Pt states he was drinking alcohol  Pt states he has abd pain on the left side  Hx of pancreatitis Pt states he had vomiting yesterday but has not had any today   Pt states he last drank about 6 hours ago

## 2017-01-03 NOTE — ED Notes (Signed)
Patient not answering from lobby  

## 2017-06-06 ENCOUNTER — Ambulatory Visit (HOSPITAL_COMMUNITY): Payer: Self-pay

## 2017-06-06 ENCOUNTER — Telehealth (HOSPITAL_COMMUNITY): Payer: Self-pay

## 2017-06-11 ENCOUNTER — Encounter (HOSPITAL_COMMUNITY): Payer: Self-pay

## 2017-06-11 ENCOUNTER — Emergency Department (HOSPITAL_COMMUNITY): Payer: 59

## 2017-06-11 ENCOUNTER — Emergency Department (HOSPITAL_COMMUNITY)
Admission: EM | Admit: 2017-06-11 | Discharge: 2017-06-11 | Disposition: A | Discharge status: Home / Self Care | Payer: 59 | Attending: Emergency Medicine | Admitting: Emergency Medicine

## 2017-06-11 DIAGNOSIS — R748 Abnormal levels of other serum enzymes: Secondary | ICD-10-CM | POA: Diagnosis not present

## 2017-06-11 DIAGNOSIS — S025XXA Fracture of tooth (traumatic), initial encounter for closed fracture: Secondary | ICD-10-CM | POA: Diagnosis not present

## 2017-06-11 DIAGNOSIS — S0990XA Unspecified injury of head, initial encounter: Secondary | ICD-10-CM | POA: Insufficient documentation

## 2017-06-11 DIAGNOSIS — M542 Cervicalgia: Secondary | ICD-10-CM | POA: Insufficient documentation

## 2017-06-11 DIAGNOSIS — Y998 Other external cause status: Secondary | ICD-10-CM | POA: Insufficient documentation

## 2017-06-11 DIAGNOSIS — Z794 Long term (current) use of insulin: Secondary | ICD-10-CM | POA: Diagnosis not present

## 2017-06-11 DIAGNOSIS — T07XXXA Unspecified multiple injuries, initial encounter: Secondary | ICD-10-CM | POA: Diagnosis not present

## 2017-06-11 DIAGNOSIS — F17210 Nicotine dependence, cigarettes, uncomplicated: Secondary | ICD-10-CM | POA: Diagnosis not present

## 2017-06-11 DIAGNOSIS — Y9389 Activity, other specified: Secondary | ICD-10-CM | POA: Diagnosis not present

## 2017-06-11 DIAGNOSIS — E119 Type 2 diabetes mellitus without complications: Secondary | ICD-10-CM | POA: Insufficient documentation

## 2017-06-11 DIAGNOSIS — Z79899 Other long term (current) drug therapy: Secondary | ICD-10-CM | POA: Insufficient documentation

## 2017-06-11 DIAGNOSIS — Y9289 Other specified places as the place of occurrence of the external cause: Secondary | ICD-10-CM | POA: Insufficient documentation

## 2017-06-11 DIAGNOSIS — S0083XA Contusion of other part of head, initial encounter: Secondary | ICD-10-CM | POA: Diagnosis not present

## 2017-06-11 LAB — COMPREHENSIVE METABOLIC PANEL
ALK PHOS: 100 U/L (ref 38–126)
ALT: 139 U/L — ABNORMAL HIGH (ref 17–63)
AST: 259 U/L — ABNORMAL HIGH (ref 15–41)
Albumin: 4.2 g/dL (ref 3.5–5.0)
Anion gap: 16 — ABNORMAL HIGH (ref 5–15)
BILIRUBIN TOTAL: 0.6 mg/dL (ref 0.3–1.2)
BUN: 6 mg/dL (ref 6–20)
CALCIUM: 9.2 mg/dL (ref 8.9–10.3)
CO2: 23 mmol/L (ref 22–32)
CREATININE: 0.61 mg/dL (ref 0.61–1.24)
Chloride: 102 mmol/L (ref 101–111)
Glucose, Bld: 304 mg/dL — ABNORMAL HIGH (ref 65–99)
Potassium: 3.9 mmol/L (ref 3.5–5.1)
SODIUM: 141 mmol/L (ref 135–145)
TOTAL PROTEIN: 6.6 g/dL (ref 6.5–8.1)

## 2017-06-11 LAB — CBC WITH DIFFERENTIAL/PLATELET
BASOS ABS: 0 10*3/uL (ref 0.0–0.1)
BASOS PCT: 0 %
EOS ABS: 0 10*3/uL (ref 0.0–0.7)
Eosinophils Relative: 0 %
HEMATOCRIT: 43.5 % (ref 39.0–52.0)
Hemoglobin: 14.7 g/dL (ref 13.0–17.0)
Lymphocytes Relative: 24 %
Lymphs Abs: 1.1 10*3/uL (ref 0.7–4.0)
MCH: 33.5 pg (ref 26.0–34.0)
MCHC: 33.8 g/dL (ref 30.0–36.0)
MCV: 99.1 fL (ref 78.0–100.0)
Monocytes Absolute: 0.3 10*3/uL (ref 0.1–1.0)
Monocytes Relative: 6 %
NEUTROS ABS: 3.4 10*3/uL (ref 1.7–7.7)
NEUTROS PCT: 70 %
Platelets: 179 10*3/uL (ref 150–400)
RBC: 4.39 MIL/uL (ref 4.22–5.81)
RDW: 12.1 % (ref 11.5–15.5)
WBC: 4.8 10*3/uL (ref 4.0–10.5)

## 2017-06-11 LAB — CBG MONITORING, ED: Glucose-Capillary: 222 mg/dL — ABNORMAL HIGH (ref 65–99)

## 2017-06-11 LAB — URINALYSIS, COMPLETE (UACMP) WITH MICROSCOPIC
BACTERIA UA: NONE SEEN
BILIRUBIN URINE: NEGATIVE
Glucose, UA: 150 mg/dL — AB
HGB URINE DIPSTICK: NEGATIVE
KETONES UR: NEGATIVE mg/dL
LEUKOCYTES UA: NEGATIVE
NITRITE: NEGATIVE
Protein, ur: NEGATIVE mg/dL
SPECIFIC GRAVITY, URINE: 1.002 — AB (ref 1.005–1.030)
Squamous Epithelial / LPF: NONE SEEN
WBC, UA: NONE SEEN WBC/hpf (ref 0–5)
pH: 7 (ref 5.0–8.0)

## 2017-06-11 MED ORDER — ONDANSETRON HCL 4 MG/2ML IJ SOLN
4.0000 mg | Freq: Once | INTRAMUSCULAR | Status: AC
  Administered 2017-06-11: 4 mg via INTRAVENOUS
  Filled 2017-06-11: qty 2

## 2017-06-11 MED ORDER — FENTANYL CITRATE (PF) 100 MCG/2ML IJ SOLN
100.0000 ug | Freq: Once | INTRAMUSCULAR | Status: AC
Start: 2017-06-11 — End: 2017-06-11
  Administered 2017-06-11: 100 ug via INTRAVENOUS
  Filled 2017-06-11: qty 2
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
## 2017-06-11 NOTE — Discharge Instructions (Signed)
Please read and follow all provided instructions.  Your diagnoses today include:  1. Multiple contusions   2. Injury of head, initial encounter   3. Facial contusion, initial encounter   4. Elevated liver enzymes     Tests performed today include:  CT scan of your head, neck, and facial bones that did not show any serious injury.  Blood counts -shows elevated liver enzymes, you should have this rechecked by your primary care doctor  Vital signs. See below for your results today.   Medications prescribed:   None  Take any prescribed medications only as directed.  Home care instructions:  Follow any educational materials contained in this packet.  Use ibuprofen or Aleve for pain as directed on the packaging.  Do not take these with alcohol.  BE VERY CAREFUL not to take multiple medicines containing Tylenol (also called acetaminophen). Doing so can lead to an overdose which can damage your liver and cause liver failure and possibly death.   Follow-up instructions: Please follow-up with your primary care provider in the next 3 days for further evaluation of your symptoms and recheck of your liver enzymes.   Return instructions:  SEEK IMMEDIATE MEDICAL ATTENTION IF:  There is confusion or drowsiness (although children frequently become drowsy after injury).   You cannot awaken the injured person.   You have more than one episode of vomiting.   You notice dizziness or unsteadiness which is getting worse, or inability to walk.   You have convulsions or unconsciousness.   You experience severe, persistent headaches not relieved by Tylenol.  You cannot use arms or legs normally.   There are changes in pupil sizes. (This is the black center in the colored part of the eye)   There is clear or bloody discharge from the nose or ears.   You have change in speech, vision, swallowing, or understanding.   Localized weakness, numbness, tingling, or change in bowel or bladder  control.  You have any other emergent concerns.  Additional Information: You have had a head injury which does not appear to require admission at this time.  Your vital signs today were: BP 122/86    Pulse 74    Temp 98.1 F (36.7 C) (Oral)    Resp 14    Ht 5\' 10"  (1.778 m)    Wt 81.6 kg (180 lb)    SpO2 92%    BMI 25.83 kg/m  If your blood pressure (BP) was elevated above 135/85 this visit, please have this repeated by your doctor within one month. --------------

## 2017-06-11 NOTE — ED Notes (Signed)
Patient observed at bedside dressing. States that he is leaving. States that he called out several times to use the bathroom. Patient notified that provider was outside the room to provide update. When asked if IV was removed, patient stated that it could stay, [facility] IV [facility]. PA at bedside to provide patient update.
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
## 2017-06-11 NOTE — ED Notes (Addendum)
Walked in Pt's room to see Pt getting dressed. Pt upset with Staff and stating he is just going to go to [facility] b/c we are not helping him. PA Josh at bedside to give Pt results of Scans. Pt stated again that he was going to just go to [facility] to get help b/c we were not giving him pain medicine nor helping him to use the bathroom. Pt had urinal at bedside. Pt ambulated out of room without any assistance.

## 2017-06-20 ENCOUNTER — Emergency Department (HOSPITAL_COMMUNITY): Payer: 59

## 2017-06-20 ENCOUNTER — Other Ambulatory Visit: Payer: Self-pay

## 2017-06-20 ENCOUNTER — Encounter (HOSPITAL_COMMUNITY): Payer: Self-pay

## 2017-06-20 ENCOUNTER — Emergency Department (HOSPITAL_COMMUNITY)
Admission: EM | Admit: 2017-06-20 | Discharge: 2017-06-20 | Disposition: A | Discharge status: Home / Self Care | Payer: 59 | Attending: Emergency Medicine | Admitting: Emergency Medicine

## 2017-06-20 DIAGNOSIS — Z794 Long term (current) use of insulin: Secondary | ICD-10-CM | POA: Insufficient documentation

## 2017-06-20 DIAGNOSIS — Y9389 Activity, other specified: Secondary | ICD-10-CM | POA: Diagnosis not present

## 2017-06-20 DIAGNOSIS — F17210 Nicotine dependence, cigarettes, uncomplicated: Secondary | ICD-10-CM | POA: Diagnosis not present

## 2017-06-20 DIAGNOSIS — S022XXA Fracture of nasal bones, initial encounter for closed fracture: Secondary | ICD-10-CM | POA: Insufficient documentation

## 2017-06-20 DIAGNOSIS — E1165 Type 2 diabetes mellitus with hyperglycemia: Secondary | ICD-10-CM | POA: Diagnosis not present

## 2017-06-20 DIAGNOSIS — Y998 Other external cause status: Secondary | ICD-10-CM | POA: Insufficient documentation

## 2017-06-20 DIAGNOSIS — R739 Hyperglycemia, unspecified: Secondary | ICD-10-CM

## 2017-06-20 DIAGNOSIS — Z23 Encounter for immunization: Secondary | ICD-10-CM | POA: Insufficient documentation

## 2017-06-20 DIAGNOSIS — Z79899 Other long term (current) drug therapy: Secondary | ICD-10-CM | POA: Insufficient documentation

## 2017-06-20 DIAGNOSIS — Y92410 Unspecified street and highway as the place of occurrence of the external cause: Secondary | ICD-10-CM | POA: Diagnosis not present

## 2017-06-20 DIAGNOSIS — F909 Attention-deficit hyperactivity disorder, unspecified type: Secondary | ICD-10-CM | POA: Diagnosis not present

## 2017-06-20 DIAGNOSIS — F319 Bipolar disorder, unspecified: Secondary | ICD-10-CM | POA: Insufficient documentation

## 2017-06-20 DIAGNOSIS — S0993XA Unspecified injury of face, initial encounter: Secondary | ICD-10-CM | POA: Diagnosis present

## 2017-06-20 LAB — BASIC METABOLIC PANEL
Anion gap: 15 (ref 5–15)
CHLORIDE: 102 mmol/L (ref 101–111)
CO2: 21 mmol/L — ABNORMAL LOW (ref 22–32)
CREATININE: 0.54 mg/dL — AB (ref 0.61–1.24)
Calcium: 8.8 mg/dL — ABNORMAL LOW (ref 8.9–10.3)
GFR calc non Af Amer: >60 mL/min (ref >60)
Glucose, Bld: 351 mg/dL — ABNORMAL HIGH (ref 65–99)
Potassium: 4.1 mmol/L (ref 3.5–5.1)
SODIUM: 138 mmol/L (ref 135–145)

## 2017-06-20 LAB — URINALYSIS, ROUTINE W REFLEX MICROSCOPIC
Bacteria, UA: NONE SEEN
Bilirubin Urine: NEGATIVE
HGB URINE DIPSTICK: NEGATIVE
KETONES UR: 5 mg/dL — AB
LEUKOCYTES UA: NEGATIVE
Nitrite: NEGATIVE
PH: 6 (ref 5.0–8.0)
Protein, ur: NEGATIVE mg/dL
RBC / HPF: NONE SEEN RBC/hpf (ref 0–5)
SQUAMOUS EPITHELIAL / LPF: NONE SEEN
Specific Gravity, Urine: 1.011 (ref 1.005–1.030)
WBC, UA: NONE SEEN WBC/hpf (ref 0–5)

## 2017-06-20 LAB — CBC
HCT: 39.1 % (ref 39.0–52.0)
Hemoglobin: 13.4 g/dL (ref 13.0–17.0)
MCH: 33.6 pg (ref 26.0–34.0)
MCHC: 34.3 g/dL (ref 30.0–36.0)
MCV: 98 fL (ref 78.0–100.0)
PLATELETS: 179 10*3/uL (ref 150–400)
RBC: 3.99 MIL/uL — ABNORMAL LOW (ref 4.22–5.81)
RDW: 11.8 % (ref 11.5–15.5)
WBC: 4.6 10*3/uL (ref 4.0–10.5)

## 2017-06-20 LAB — CBG MONITORING, ED: Glucose-Capillary: 398 mg/dL — ABNORMAL HIGH (ref 65–99)

## 2017-06-20 MED ORDER — MORPHINE SULFATE (PF) 4 MG/ML IV SOLN
4.0000 mg | Freq: Once | INTRAVENOUS | Status: AC
  Administered 2017-06-20: 4 mg via INTRAVENOUS
  Filled 2017-06-20: qty 1

## 2017-06-20 MED ORDER — SODIUM CHLORIDE 0.9 % IV BOLUS (SEPSIS)
1000.0000 mL | Freq: Once | INTRAVENOUS | Status: AC
  Administered 2017-06-20: 1000 mL via INTRAVENOUS

## 2017-06-20 MED ORDER — TETANUS-DIPHTH-ACELL PERTUSSIS 5-2.5-18.5 LF-MCG/0.5 IM SUSP
0.5000 mL | Freq: Once | INTRAMUSCULAR | Status: AC
  Administered 2017-06-20: 0.5 mL via INTRAMUSCULAR
  Filled 2017-06-20: qty 0.5

## 2017-06-20 MED ORDER — IBUPROFEN 600 MG PO TABS: 600.0000 mg | ORAL_TABLET | Freq: Four times a day (QID) | ORAL | 0 refills | Status: AC | PRN

## 2017-06-20 NOTE — ED Notes (Signed)
Patient transported to CT 

## 2017-06-20 NOTE — ED Notes (Signed)
ED Provider at bedside. 
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
## 2017-09-10 ENCOUNTER — Other Ambulatory Visit: Payer: Self-pay

## 2017-09-10 ENCOUNTER — Ambulatory Visit (INDEPENDENT_AMBULATORY_CARE_PROVIDER_SITE_OTHER): Payer: 59 | Admitting: Endocrinology

## 2017-09-10 ENCOUNTER — Encounter: Payer: Self-pay | Admitting: Endocrinology

## 2017-09-10 VITALS — BP 112/78 | HR 109 | Wt 162.8 lb

## 2017-09-10 DIAGNOSIS — E119 Type 2 diabetes mellitus without complications: Secondary | ICD-10-CM | POA: Diagnosis not present

## 2017-09-10 LAB — POCT GLYCOSYLATED HEMOGLOBIN (HGB A1C): Hemoglobin A1C: 9.9 % — AB (ref 4.0–5.6)

## 2017-09-10 MED ORDER — INSULIN DEGLUDEC 100 UNIT/ML ~~LOC~~ SOPN: 50.0000 [IU] | PEN_INJECTOR | Freq: Every day | SUBCUTANEOUS | 11 refills | Status: AC

## 2017-09-10 MED ORDER — INSULIN DEGLUDEC 100 UNIT/ML ~~LOC~~ SOPN: 50.0000 [IU] | PEN_INJECTOR | Freq: Every day | SUBCUTANEOUS | 11 refills | Status: DC

## 2017-09-10 MED ORDER — INSULIN ASPART 100 UNIT/ML FLEXPEN: 6.0000 [IU] | PEN_INJECTOR | Freq: Three times a day (TID) | SUBCUTANEOUS | 11 refills | Status: DC

## 2017-09-10 MED ORDER — GLUCOSE BLOOD VI STRP: 1.0000 | ORAL_STRIP | Freq: Four times a day (QID) | 12 refills | Status: DC

## 2017-09-10 NOTE — Patient Instructions (Addendum)
Please change your insulin to the numbers listed below.  check your blood sugar 4 times a day: before the 3 meals, and at bedtime.  also check if you have symptoms of your blood sugar being too high or too low.  please keep a record of the readings and bring it to your next appointment here (or you can bring the meter itself).  You can write it on any piece of paper.  please call us sooner if your blood sugar goes below 70, or if you have a lot of readings over 200.   Please come back for a follow-up appointment in 2 months.   

## 2017-09-10 NOTE — Progress Notes (Signed)
Subjective:    Patient ID: [name] Wall, male    DOB: 1990/06/18, 27 y.o.   MRN: [id number]  HPI Pt returns for f/u of diabetes mellitus:  DM type: 1 Dx'ed: 2015 Complications: none Therapy: insulin since  DKA: only at time of rx Severe hypoglycemia: last episode was early 2017.   Pancreatitis: never Other: rx has been limited by noncompliance, alcoholism, marijuana use, and bipolar disorder (pt says he cannot afford psychiatry copay); he works at [employer], variable shifts; he takes multiple daily injections.  Interval history: no cbg record, but states cbg's vary widely (50-400).  There is no trend throughout the day.  He says he sometimes misses the insulin.  Past Medical History:  Diagnosis Date  . ADD (attention deficit disorder)   . Bipolar disorder (HCC)   . Diabetes mellitus without complication (HCC)   . Pancreatitis     Past Surgical History:  Procedure Laterality Date  . Arm surgery    . FINGER SURGERY      Social History   Socioeconomic History  . Marital status: Single    Spouse name: Not on file  . Number of children: Not on file  . Years of education: Not on file  . Highest education level: Not on file  Occupational History  . Not on file  Social Needs  . Financial resource strain: Not on file  . Food insecurity:    Worry: Not on file    Inability: Not on file  . Transportation needs:    Medical: Not on file    Non-medical: Not on file  Tobacco Use  . Smoking status: Current Every Day Smoker    Packs/day: 1.00    Types: Cigarettes  . Smokeless tobacco: Former Engineer, waterUser  Substance and Sexual Activity  . Alcohol use: Yes    Alcohol/week: 0.0 oz  . Drug use: No  . Sexual activity: Not on file  Lifestyle  . Physical activity:    Days per week: Not on file    Minutes per session: Not on file  . Stress: Not on file  Relationships  . Social connections:    Talks on phone: Not on file    Gets together: Not on file    Attends religious  service: Not on file    Active member of club or organization: Not on file    Attends meetings of clubs or organizations: Not on file    Relationship status: Not on file  . Intimate partner violence:    Fear of current or ex partner: Not on file    Emotionally abused: Not on file    Physically abused: Not on file    Forced sexual activity: Not on file  Other Topics Concern  . Not on file  Social History Narrative  . Not on file    Current Outpatient Medications on File Prior to Visit  Medication Sig Dispense Refill  . amphetamine-dextroamphetamine (ADDERALL) 30 MG tablet Take 30 mg by mouth 2 (two) times daily.      . nicotine (NICODERM CQ - DOSED IN MG/24 HOURS) 21 mg/24hr patch Place 1 patch (21 mg total) onto the skin daily. 28 patch 11  . sertraline (ZOLOFT) 100 MG tablet Take 100 mg by mouth daily.     No current facility-administered medications on file prior to visit.     Allergies  Allergen Reactions  . Shellfish Allergy     Family History  Problem Relation Age of Onset  . Diabetes Father   .  Cancer Other   . CAD Other     BP 112/78   Pulse (!) 109   Wt 162 lb 12.8 oz (73.8 kg)   SpO2 97%   BMI 23.36 kg/m    Review of Systems He denies LOC     Objective:   Physical Exam VITAL SIGNS:  See vs page GENERAL: no distress Pulses: foot pulses are intact bilaterally.   MSK: no deformity of the feet or ankles.  CV: no edema of the legs or ankles Skin:  no ulcer on the feet or ankles.  normal color and temp on the feet and ankles Neuro: sensation is intact to touch on the feet and ankles.     Lab Results  Component Value Date   HGBA1C 9.9 (A) 09/10/2017       Assessment & Plan:  Type 1 DM: he needs increased rx.  Noncompliance with cbg recording: this limits DM rx.    Patient Instructions  Please change your insulin to the numbers listed below.   check your blood sugar 4 times a day: before the 3 meals, and at bedtime.  also check if you have  symptoms of your blood sugar being too high or too low.  please keep a record of the readings and bring it to your next appointment here (or you can bring the meter itself).  You can write it on any piece of paper.  please call us sooner if your blood sugar goes below 70, or if you have a lot of readings over 200.   Please come back for a follow-up appointment in 2 months.

## 2017-09-13 ENCOUNTER — Telehealth: Payer: Self-pay

## 2017-09-13 ENCOUNTER — Other Ambulatory Visit: Payer: Self-pay

## 2017-09-13 MED ORDER — GLUCOSE BLOOD VI STRP: ORAL_STRIP | 12 refills | Status: AC

## 2017-09-13 NOTE — Telephone Encounter (Signed)
I have called patient & he will be coming by to pick up accu chek guide meter. I have sent strips to pharmacy.

## 2017-09-13 NOTE — Telephone Encounter (Signed)
Patients mom called and stated the meter that her son was given is not covered by their insurance it has to be Accu-chek

## 2017-09-16 ENCOUNTER — Telehealth: Payer: Self-pay | Admitting: Endocrinology

## 2017-09-16 NOTE — Telephone Encounter (Signed)
Patient dad came to the office to pick up his son glucose meter Accu check guide, he stated he was told to come pick it up. He will come back tomorrow, to pick it up

## 2017-09-17 NOTE — Telephone Encounter (Signed)
I have put meter up front in office for patient's dad to pick up.

## 2017-10-30 ENCOUNTER — Other Ambulatory Visit: Payer: Self-pay | Admitting: Endocrinology

## 2017-11-22 ENCOUNTER — Ambulatory Visit: Payer: 59 | Admitting: Endocrinology

## 2017-11-22 DIAGNOSIS — Z0289 Encounter for other administrative examinations: Secondary | ICD-10-CM

## 2018-02-12 ENCOUNTER — Other Ambulatory Visit: Payer: Self-pay

## 2018-02-12 ENCOUNTER — Inpatient Hospital Stay (HOSPITAL_COMMUNITY)
Admission: EM | Admit: 2018-02-12 | Discharge: 2018-02-15 | DRG: 637 | Disposition: A | Discharge status: Home / Self Care | Payer: 59 | Attending: Internal Medicine | Admitting: Internal Medicine

## 2018-02-12 ENCOUNTER — Encounter (HOSPITAL_COMMUNITY): Payer: Self-pay

## 2018-02-12 DIAGNOSIS — Z833 Family history of diabetes mellitus: Secondary | ICD-10-CM

## 2018-02-12 DIAGNOSIS — Z9114 Patient's other noncompliance with medication regimen: Secondary | ICD-10-CM

## 2018-02-12 DIAGNOSIS — R112 Nausea with vomiting, unspecified: Secondary | ICD-10-CM | POA: Diagnosis not present

## 2018-02-12 DIAGNOSIS — E876 Hypokalemia: Secondary | ICD-10-CM | POA: Diagnosis present

## 2018-02-12 DIAGNOSIS — R7401 Elevation of levels of liver transaminase levels: Secondary | ICD-10-CM

## 2018-02-12 DIAGNOSIS — E1010 Type 1 diabetes mellitus with ketoacidosis without coma: Secondary | ICD-10-CM | POA: Diagnosis not present

## 2018-02-12 DIAGNOSIS — Z79899 Other long term (current) drug therapy: Secondary | ICD-10-CM

## 2018-02-12 DIAGNOSIS — Z794 Long term (current) use of insulin: Secondary | ICD-10-CM

## 2018-02-12 DIAGNOSIS — T730XXA Starvation, initial encounter: Secondary | ICD-10-CM | POA: Diagnosis present

## 2018-02-12 DIAGNOSIS — E8729 Other acidosis: Secondary | ICD-10-CM

## 2018-02-12 DIAGNOSIS — R0602 Shortness of breath: Secondary | ICD-10-CM

## 2018-02-12 DIAGNOSIS — K852 Alcohol induced acute pancreatitis without necrosis or infection: Secondary | ICD-10-CM

## 2018-02-12 DIAGNOSIS — F1210 Cannabis abuse, uncomplicated: Secondary | ICD-10-CM | POA: Diagnosis present

## 2018-02-12 DIAGNOSIS — F1020 Alcohol dependence, uncomplicated: Secondary | ICD-10-CM

## 2018-02-12 DIAGNOSIS — R740 Nonspecific elevation of levels of transaminase and lactic acid dehydrogenase [LDH]: Secondary | ICD-10-CM

## 2018-02-12 DIAGNOSIS — X58XXXA Exposure to other specified factors, initial encounter: Secondary | ICD-10-CM | POA: Diagnosis present

## 2018-02-12 DIAGNOSIS — F319 Bipolar disorder, unspecified: Secondary | ICD-10-CM | POA: Diagnosis present

## 2018-02-12 DIAGNOSIS — E872 Acidosis: Secondary | ICD-10-CM

## 2018-02-12 DIAGNOSIS — E1110 Type 2 diabetes mellitus with ketoacidosis without coma: Secondary | ICD-10-CM | POA: Diagnosis present

## 2018-02-12 DIAGNOSIS — K760 Fatty (change of) liver, not elsewhere classified: Secondary | ICD-10-CM | POA: Diagnosis present

## 2018-02-12 DIAGNOSIS — F988 Other specified behavioral and emotional disorders with onset usually occurring in childhood and adolescence: Secondary | ICD-10-CM | POA: Diagnosis present

## 2018-02-12 DIAGNOSIS — F17210 Nicotine dependence, cigarettes, uncomplicated: Secondary | ICD-10-CM | POA: Diagnosis present

## 2018-02-12 LAB — CBC
HEMATOCRIT: 47.7 % (ref 39.0–52.0)
Hemoglobin: 15.7 g/dL (ref 13.0–17.0)
MCH: 31.5 pg (ref 26.0–34.0)
MCHC: 32.9 g/dL (ref 30.0–36.0)
MCV: 95.6 fL (ref 80.0–100.0)
Platelets: 268 10*3/uL (ref 150–400)
RBC: 4.99 MIL/uL (ref 4.22–5.81)
RDW: 11.7 % (ref 11.5–15.5)
WBC: 8.7 10*3/uL (ref 4.0–10.5)
nRBC: 0 % (ref 0.0–0.2)

## 2018-02-12 LAB — COMPREHENSIVE METABOLIC PANEL
ALT: 232 U/L — AB (ref 0–44)
AST: 232 U/L — AB (ref 15–41)
Albumin: 5.2 g/dL — ABNORMAL HIGH (ref 3.5–5.0)
Alkaline Phosphatase: 112 U/L (ref 38–126)
BUN: 11 mg/dL (ref 6–20)
CALCIUM: 8.8 mg/dL — AB (ref 8.9–10.3)
Chloride: 100 mmol/L (ref 98–111)
Creatinine, Ser: 0.85 mg/dL (ref 0.61–1.24)
GFR calc non Af Amer: >60 mL/min (ref >60)
Glucose, Bld: 351 mg/dL — ABNORMAL HIGH (ref 70–99)
Potassium: 4.4 mmol/L (ref 3.5–5.1)
SODIUM: 134 mmol/L — AB (ref 135–145)
Total Bilirubin: 2.2 mg/dL — ABNORMAL HIGH (ref 0.3–1.2)
Total Protein: 8.4 g/dL — ABNORMAL HIGH (ref 6.5–8.1)

## 2018-02-12 LAB — ETHANOL: Alcohol, Ethyl (B): <10 mg/dL (ref <10)

## 2018-02-12 MED ORDER — METOCLOPRAMIDE HCL 5 MG/ML IJ SOLN: 10.0000 mg | Freq: Once | INTRAMUSCULAR | Status: DC

## 2018-02-12 MED ORDER — SODIUM BICARBONATE 8.4 % IV SOLN
50.0000 meq | Freq: Once | INTRAVENOUS | Status: AC
  Administered 2018-02-13: 50 meq via INTRAVENOUS
  Filled 2018-02-12: qty 50

## 2018-02-12 MED ORDER — SODIUM CHLORIDE 0.9 % IV BOLUS
2000.0000 mL | Freq: Once | INTRAVENOUS | Status: AC
  Administered 2018-02-13: 2000 mL via INTRAVENOUS

## 2018-02-12 MED ORDER — LORAZEPAM 2 MG/ML IJ SOLN
1.0000 mg | Freq: Once | INTRAMUSCULAR | Status: AC
  Administered 2018-02-13: 1 mg via INTRAVENOUS
  Filled 2018-02-12: qty 1

## 2018-02-12 MED ORDER — MORPHINE SULFATE (PF) 4 MG/ML IV SOLN
4.0000 mg | Freq: Once | INTRAVENOUS | Status: AC
  Administered 2018-02-13: 4 mg via INTRAVENOUS
  Filled 2018-02-12: qty 1

## 2018-02-12 MED ORDER — INSULIN REGULAR(HUMAN) IN NACL 100-0.9 UT/100ML-% IV SOLN
INTRAVENOUS | Status: DC
  Administered 2018-02-13: 3.7 [IU]/h via INTRAVENOUS
  Filled 2018-02-12: qty 100

## 2018-02-12 MED ORDER — POTASSIUM CHLORIDE 10 MEQ/100ML IV SOLN
10.0000 meq | INTRAVENOUS | Status: AC
  Administered 2018-02-13 (×2): 10 meq via INTRAVENOUS
  Filled 2018-02-12 (×2): qty 100

## 2018-02-12 MED ORDER — DEXTROSE-NACL 5-0.45 % IV SOLN: INTRAVENOUS | Status: DC

## 2018-02-12 NOTE — ED Provider Notes (Signed)
[facility]-EMERGENCY DEPT Provider Note   CSN: [id number] Arrival date & time: 02/12/18  1842    History   Chief Complaint Chief Complaint  Patient presents with  . Alcohol Problem    LEVEL 5 CAVEAT 2/2 ACUITY OF CONDITION   HPI [name] Wall is a 27 y.o. male.   27 year old male with history of bipolar disorder, pancreatitis, IDDM presents to the emergency department for evaluation of nausea, vomiting, shortness of breath.  He states that his symptoms have been worsening over the past 2 days.  Reports a burning sensation in his central chest with dry heaves.  He has tried drinking fluids, but has not been able to tolerate anything by mouth without vomiting.  Reports history of alcohol abuse.  Usually drinks a half of a bottle of the fifth of liquor per day.  Reports that his last drink was on Monday.  He did try to have a beer yesterday feeling that it may help his symptoms, but he was unable to tolerate it.  Mother states that the patient is concerned that he may have also had a seizure within the last 48 hours.  He has not taken his insulin in the last day as he has been unable to tolerate anything by mouth.     Past Medical History:  Diagnosis Date  . ADD (attention deficit disorder)   . Bipolar disorder (HCC)   . Diabetes mellitus without complication (HCC)   . Pancreatitis     Patient Active Problem List   Diagnosis Date Noted  . DKA (diabetic ketoacidoses) (HCC) 02/13/2018  . Diabetes mellitus without complication (HCC) 02/14/2016  . Fracture of fifth metacarpal bone of right hand 07/07/2015    Past Surgical History:  Procedure Laterality Date  . Arm surgery    . FINGER SURGERY          Home Medications    Prior to Admission medications   Medication Sig Start Date End Date Taking? Authorizing Provider  amphetamine-dextroamphetamine (ADDERALL) 30 MG tablet Take 30 mg by mouth 2 (two) times daily.     Yes [provider]    insulin degludec (TRESIBA FLEXTOUCH) 100 UNIT/ML SOPN FlexTouch Pen Inject 0.5 mLs (50 Units total) into the skin daily. 09/10/17  Yes Romero Belling, MD  NOVOLOG FLEXPEN 100 UNIT/ML FlexPen INJECT 6 UNITS INTO THE SKIN 3 (THREE) TIMES DAILY WITH MEALS. AND PEN NEEDLES 4/DAY Patient taking differently: Inject 10 Units into the skin 3 (three) times daily with meals.  10/30/17  Yes Romero Belling, MD  glucose blood (ACCU-CHEK GUIDE) test strip Used to check blood sugars 4x daily. 09/13/17   Romero Belling, MD  nicotine (NICODERM CQ - DOSED IN MG/24 HOURS) 21 mg/24hr patch Place 1 patch (21 mg total) onto the skin daily. Patient not taking: Reported on 02/13/2018 02/13/16   Romero Belling, MD    Family History Family History  Problem Relation Age of Onset  . Diabetes Father   . Cancer Other   . CAD Other     Social History Social History   Tobacco Use  . Smoking status: Current Every Day Smoker    Packs/day: 0.50    Types: Cigarettes  . Smokeless tobacco: Former Engineer, water Use Topics  . Alcohol use: Yes    Comment: pt states half of a fifth/day  . Drug use: No     Allergies   Patient has no known allergies.   Review of Systems Review of Systems  Unable  to perform ROS: Acuity of condition    Physical Exam Updated Vital Signs BP (!) 134/92   Pulse (!) 121   Temp 97.8 F (36.6 C) (Oral)   Resp 20   Ht 5\' 10"  (1.778 m)   Wt 74.8 kg   SpO2 100%   BMI 23.68 kg/m   Physical Exam  Constitutional: He is oriented to person, place, and time. He appears well-developed and well-nourished. No distress.  Appears ill, diaphoretic. Dry heaving.  HENT:  Head: Normocephalic and atraumatic.  Dry mm  Eyes: Conjunctivae and EOM are normal. No scleral icterus.  Neck: Normal range of motion.  Cardiovascular: Regular rhythm and intact distal pulses.  Tachycardic  Pulmonary/Chest: No accessory muscle usage or stridor. Tachypnea noted. No respiratory distress.  No hypoxia on room air.   Musculoskeletal: Normal range of motion.  Neurological: He is alert and oriented to person, place, and time. He exhibits normal muscle tone. Coordination normal.  GCS 15. Moving all extremities.  Skin: Skin is warm. No rash noted. He is diaphoretic. No erythema. No pallor.  Psychiatric: He has a normal mood and affect. His behavior is normal.  Nursing note and vitals reviewed.    ED Treatments / Results  Labs (all labs ordered are listed, but only abnormal results are displayed) Labs Reviewed  COMPREHENSIVE METABOLIC PANEL - Abnormal; Notable for the following components:      Result Value   Sodium 134 (*)    CO2 <7 (*)    Glucose, Bld 351 (*)    Calcium 8.8 (*)    Total Protein 8.4 (*)    Albumin 5.2 (*)    AST 232 (*)    ALT 232 (*)    Total Bilirubin 2.2 (*)    All other components within normal limits  RAPID URINE DRUG SCREEN, HOSP PERFORMED - Abnormal; Notable for the following components:   Benzodiazepines POSITIVE (*)    Tetrahydrocannabinol POSITIVE (*)    All other components within normal limits  URINALYSIS, ROUTINE W REFLEX MICROSCOPIC - Abnormal; Notable for the following components:   Glucose, UA >=500 (*)    Hgb urine dipstick LARGE (*)    Ketones, ur 80 (*)    Protein, ur >=300 (*)    RBC / HPF >50 (*)    Bacteria, UA RARE (*)    All other components within normal limits  LIPASE, BLOOD - Abnormal; Notable for the following components:   Lipase 1,125 (*)    All other components within normal limits  BLOOD GAS, ARTERIAL - Abnormal; Notable for the following components:   pH, Arterial 6.943 (*)    pO2, Arterial 137 (*)    All other components within normal limits  BLOOD GAS, ARTERIAL - Abnormal; Notable for the following components:   pH, Arterial 6.982 (*)    pO2, Arterial 130 (*)    All other components within normal limits  I-STAT CG4 LACTIC ACID, ED - Abnormal; Notable for the following components:   Lactic Acid, Venous 3.83 (*)    All other  components within normal limits  CBG MONITORING, ED - Abnormal; Notable for the following components:   Glucose-Capillary 434 (*)    All other components within normal limits  I-STAT CG4 LACTIC ACID, ED - Abnormal; Notable for the following components:   Lactic Acid, Venous 2.02 (*)    All other components within normal limits  CBG MONITORING, ED - Abnormal; Notable for the following components:   Glucose-Capillary 338 (*)  All other components within normal limits  CBG MONITORING, ED - Abnormal; Notable for the following components:   Glucose-Capillary 290 (*)    All other components within normal limits  ETHANOL  CBC  I-STAT TROPONIN, ED    EKG None  Radiology No results found.  Procedures Procedures (including critical care time)  Medications Ordered in ED Medications  insulin regular, human (MYXREDLIN) 100 units/ 100 mL infusion (4.6 Units/hr Intravenous Rate/Dose Change 02/13/18 0327)  dextrose 5 %-0.45 % sodium chloride infusion ( Intravenous Hold 02/13/18 0027)  sodium chloride 0.9 % bolus 2,000 mL (2,000 mLs Intravenous New Bag/Given 02/13/18 0345)  LORazepam (ATIVAN) injection 1 mg (has no administration in time range)  sodium chloride 0.9 % bolus 2,000 mL (0 mLs Intravenous Stopped 02/13/18 0131)  LORazepam (ATIVAN) injection 1 mg (1 mg Intravenous Given 02/13/18 0026)  potassium chloride 10 mEq in 100 mL IVPB (0 mEq Intravenous Stopped 02/13/18 0131)  morphine 4 MG/ML injection 4 mg (4 mg Intravenous Given 02/13/18 0026)  sodium bicarbonate injection 50 mEq (50 mEq Intravenous Given 02/13/18 0043)  sodium bicarbonate injection 50 mEq (50 mEq Intravenous Given 02/13/18 0344)    CRITICAL CARE Performed by: Antony Madura   Total critical care time: 60 minutes  Critical care time was exclusive of separately billable procedures and treating other patients.  Critical care was necessary to treat or prevent imminent or life-threatening deterioration.  Critical care was  time spent personally by me on the following activities: development of treatment plan with patient and/or surrogate as well as nursing, discussions with consultants, evaluation of patient's response to treatment, examination of patient, obtaining history from patient or surrogate, ordering and performing treatments and interventions, ordering and review of laboratory studies, ordering and review of radiographic studies, pulse oximetry and re-evaluation of patient's condition.   12:20 AM  Repeat page placed to PCCM  12:50 AM Spoke with Dr. Belia Heman of PCCM. PCCM to assess the patient in the ED for admission.  1:32 AM Lipase elevated c/w associated pancreatitis, suspected 2/2 ETOH abuse.  1:56 AM Spoke with Dr. Belia Heman of PCCM; requesting consult to [facility] for admission given high volume of acutely ill patients to be seen by PCCM at [facility]. PCCM will continue plan of consultation.  2:07 AM Dr. Toniann Fail of [facility] to admit. Lactate improving down to 2.02. Will repeat ABG for trending.  2:33 AM Only slight improvement to repeat ABG. PH now 6.982. pCO2 remains unreadable. Ordered 2nd amp bicarb.  3:57 AM Patient transferred to SDU   Initial Impression / Assessment and Plan / ED Course  I have reviewed the triage vital signs and the nursing notes.  Pertinent labs & imaging results that were available during my care of the patient were reviewed by me and considered in my medical decision making (see chart for details).     27 year old male with history of alcohol abuse and dependence, last drink on Monday, presents to the emergency department for nausea, vomiting, shortness of breath.  He is ill-appearing, diaphoretic, tachycardic with dry mucous membranes.  Clinical picture consistent with acute acidosis.  Patient with unreadable bicarb with pH of 6.94 on ABG.  Suspect acidosis to be secondary to acute alcohol withdrawal and metabolization; however, there is also likely a component of DKA as the patient  is a type I diabetic.  Started on glucose stabilizer.  Work-up today also notable for transaminitis consistent with known alcohol abuse.  He has an elevated lipase to suggest acute pancreatitis as well.  The patient has been given 2 amps of bicarb as well as multiple boluses of IVF. Ativan 1mg  IV given x 2 as there is likely a component of acute withdrawal; secondarily used to help manage the patient's nausea.  Plan for admission to [facility], case discussed with Dr. Toniann Fail. PCCM to consult.   Final Clinical Impressions(s) / ED Diagnoses   Final diagnoses:  Alcoholic ketoacidosis  Diabetic ketoacidosis without coma associated with type 1 diabetes mellitus (HCC)  Transaminitis  Alcohol abuse with physiological dependence (HCC)  Alcohol-induced acute pancreatitis, unspecified complication status    ED Discharge Orders    None       Antony Madura, PA-C 02/13/18 0357    Paula Libra, MD 02/13/18 [phone]

## 2018-02-12 NOTE — ED Notes (Signed)
Elsie Ra, RN made aware of vital signs previously charted. Pt placed on monitor.

## 2018-02-12 NOTE — ED Triage Notes (Signed)
Pt states that he has been having emesis and upper abdominal pains, which he attributes to detox. Pt states a few beers yesterday was his last drink. Pt states he typically drinks a half of a fifth of liquor/day.

## 2018-02-12 NOTE — Patient Outreach (Signed)
[facility] met with the patient to provide substance use recovery support and help with recovery resources. Patient has been drinking daily, but recently has been trying to cut down on his alcohol use. Patient does not use any other substances at this time. [facility]PSS provided the patient with several recovery resources including residential/outpatient substance use treatment center list, detox center list, NA/AA meeting list, and [facility] contact information. [facility]PSS also encouraged the patient to ask to speak with social work if anymore questions arise regarding substance use treatment options since [facility]PSS is about to leave for the day. CPSS strongly encouraged the patient to continue to stay in contact with [facility] for substance use recovery support for his alcohol use after future discharge from the [facility]. [facility] also strongly suggested the patient to  continue stay in contact with [facility] for further help with substance use treatment center resources.

## 2018-02-12 NOTE — ED Notes (Signed)
Called respiratory to bedside for blood gas

## 2018-02-13 ENCOUNTER — Encounter (HOSPITAL_COMMUNITY): Payer: Self-pay | Admitting: Internal Medicine

## 2018-02-13 ENCOUNTER — Inpatient Hospital Stay (HOSPITAL_COMMUNITY): Payer: 59

## 2018-02-13 DIAGNOSIS — Z833 Family history of diabetes mellitus: Secondary | ICD-10-CM | POA: Diagnosis not present

## 2018-02-13 DIAGNOSIS — Z79899 Other long term (current) drug therapy: Secondary | ICD-10-CM | POA: Diagnosis not present

## 2018-02-13 DIAGNOSIS — T730XXA Starvation, initial encounter: Secondary | ICD-10-CM | POA: Diagnosis present

## 2018-02-13 DIAGNOSIS — E1010 Type 1 diabetes mellitus with ketoacidosis without coma: Secondary | ICD-10-CM

## 2018-02-13 DIAGNOSIS — R112 Nausea with vomiting, unspecified: Secondary | ICD-10-CM | POA: Diagnosis present

## 2018-02-13 DIAGNOSIS — F17210 Nicotine dependence, cigarettes, uncomplicated: Secondary | ICD-10-CM | POA: Diagnosis present

## 2018-02-13 DIAGNOSIS — F1210 Cannabis abuse, uncomplicated: Secondary | ICD-10-CM | POA: Diagnosis present

## 2018-02-13 DIAGNOSIS — K852 Alcohol induced acute pancreatitis without necrosis or infection: Secondary | ICD-10-CM | POA: Diagnosis not present

## 2018-02-13 DIAGNOSIS — F1020 Alcohol dependence, uncomplicated: Secondary | ICD-10-CM | POA: Diagnosis not present

## 2018-02-13 DIAGNOSIS — R7401 Elevation of levels of liver transaminase levels: Secondary | ICD-10-CM

## 2018-02-13 DIAGNOSIS — R740 Nonspecific elevation of levels of transaminase and lactic acid dehydrogenase [LDH]: Secondary | ICD-10-CM

## 2018-02-13 DIAGNOSIS — X58XXXA Exposure to other specified factors, initial encounter: Secondary | ICD-10-CM | POA: Diagnosis present

## 2018-02-13 DIAGNOSIS — Z9114 Patient's other noncompliance with medication regimen: Secondary | ICD-10-CM | POA: Diagnosis not present

## 2018-02-13 DIAGNOSIS — E1110 Type 2 diabetes mellitus with ketoacidosis without coma: Secondary | ICD-10-CM | POA: Diagnosis present

## 2018-02-13 DIAGNOSIS — F988 Other specified behavioral and emotional disorders with onset usually occurring in childhood and adolescence: Secondary | ICD-10-CM | POA: Diagnosis present

## 2018-02-13 DIAGNOSIS — Z794 Long term (current) use of insulin: Secondary | ICD-10-CM | POA: Diagnosis not present

## 2018-02-13 DIAGNOSIS — K760 Fatty (change of) liver, not elsewhere classified: Secondary | ICD-10-CM | POA: Diagnosis present

## 2018-02-13 DIAGNOSIS — F319 Bipolar disorder, unspecified: Secondary | ICD-10-CM | POA: Diagnosis present

## 2018-02-13 DIAGNOSIS — E876 Hypokalemia: Secondary | ICD-10-CM | POA: Diagnosis present

## 2018-02-13 LAB — BASIC METABOLIC PANEL
ANION GAP: 10 (ref 5–15)
ANION GAP: 9 (ref 5–15)
Anion gap: 20 — ABNORMAL HIGH (ref 5–15)
Anion gap: 17 — ABNORMAL HIGH (ref 5–15)
Anion gap: 17 — ABNORMAL HIGH (ref 5–15)
BUN: 10 mg/dL (ref 6–20)
BUN: 8 mg/dL (ref 6–20)
BUN: 7 mg/dL (ref 6–20)
BUN: 7 mg/dL (ref 6–20)
BUN: 7 mg/dL (ref 6–20)
CALCIUM: 7.3 mg/dL — AB (ref 8.9–10.3)
CALCIUM: 7.2 mg/dL — AB (ref 8.9–10.3)
CALCIUM: 7.5 mg/dL — AB (ref 8.9–10.3)
CALCIUM: 7.8 mg/dL — AB (ref 8.9–10.3)
CALCIUM: 7.6 mg/dL — AB (ref 8.9–10.3)
CHLORIDE: 108 mmol/L (ref 98–111)
CHLORIDE: 109 mmol/L (ref 98–111)
CHLORIDE: 109 mmol/L (ref 98–111)
CO2: 9 mmol/L — AB (ref 22–32)
CO2: 11 mmol/L — ABNORMAL LOW (ref 22–32)
CO2: 17 mmol/L — AB (ref 22–32)
CO2: 20 mmol/L — AB (ref 22–32)
CREATININE: 0.79 mg/dL (ref 0.61–1.24)
CREATININE: 0.65 mg/dL (ref 0.61–1.24)
CREATININE: 0.67 mg/dL (ref 0.61–1.24)
CREATININE: 0.54 mg/dL — AB (ref 0.61–1.24)
Chloride: 112 mmol/L — ABNORMAL HIGH (ref 98–111)
Chloride: 110 mmol/L (ref 98–111)
Creatinine, Ser: 0.56 mg/dL — ABNORMAL LOW (ref 0.61–1.24)
GFR calc Af Amer: >60 mL/min (ref >60)
GFR calc Af Amer: >60 mL/min (ref >60)
GFR calc Af Amer: >60 mL/min (ref >60)
GFR calc Af Amer: >60 mL/min (ref >60)
GFR calc Af Amer: >60 mL/min (ref >60)
GFR calc non Af Amer: >60 mL/min (ref >60)
GFR calc non Af Amer: >60 mL/min (ref >60)
GFR calc non Af Amer: >60 mL/min (ref >60)
GFR calc non Af Amer: >60 mL/min (ref >60)
GFR calc non Af Amer: >60 mL/min (ref >60)
GLUCOSE: 149 mg/dL — AB (ref 70–99)
GLUCOSE: 170 mg/dL — AB (ref 70–99)
GLUCOSE: 248 mg/dL — AB (ref 70–99)
GLUCOSE: 117 mg/dL — AB (ref 70–99)
GLUCOSE: 107 mg/dL — AB (ref 70–99)
Potassium: 4.3 mmol/L (ref 3.5–5.1)
Potassium: 3.9 mmol/L (ref 3.5–5.1)
Potassium: 3.2 mmol/L — ABNORMAL LOW (ref 3.5–5.1)
Potassium: 2.8 mmol/L — ABNORMAL LOW (ref 3.5–5.1)
Potassium: 3.2 mmol/L — ABNORMAL LOW (ref 3.5–5.1)
Sodium: 139 mmol/L (ref 135–145)
Sodium: 136 mmol/L (ref 135–145)
Sodium: 136 mmol/L (ref 135–145)
Sodium: 136 mmol/L (ref 135–145)
Sodium: 138 mmol/L (ref 135–145)

## 2018-02-13 LAB — GLUCOSE, CAPILLARY
GLUCOSE-CAPILLARY: 125 mg/dL — AB (ref 70–99)
GLUCOSE-CAPILLARY: 188 mg/dL — AB (ref 70–99)
GLUCOSE-CAPILLARY: 192 mg/dL — AB (ref 70–99)
GLUCOSE-CAPILLARY: 252 mg/dL — AB (ref 70–99)
GLUCOSE-CAPILLARY: 217 mg/dL — AB (ref 70–99)
GLUCOSE-CAPILLARY: 177 mg/dL — AB (ref 70–99)
GLUCOSE-CAPILLARY: 95 mg/dL (ref 70–99)
Glucose-Capillary: 189 mg/dL — ABNORMAL HIGH (ref 70–99)
Glucose-Capillary: 154 mg/dL — ABNORMAL HIGH (ref 70–99)
Glucose-Capillary: 142 mg/dL — ABNORMAL HIGH (ref 70–99)
Glucose-Capillary: 181 mg/dL — ABNORMAL HIGH (ref 70–99)
Glucose-Capillary: 177 mg/dL — ABNORMAL HIGH (ref 70–99)
Glucose-Capillary: 145 mg/dL — ABNORMAL HIGH (ref 70–99)
Glucose-Capillary: 131 mg/dL — ABNORMAL HIGH (ref 70–99)
Glucose-Capillary: 82 mg/dL (ref 70–99)
Glucose-Capillary: 160 mg/dL — ABNORMAL HIGH (ref 70–99)
Glucose-Capillary: 101 mg/dL — ABNORMAL HIGH (ref 70–99)
Glucose-Capillary: 105 mg/dL — ABNORMAL HIGH (ref 70–99)

## 2018-02-13 LAB — RAPID URINE DRUG SCREEN, HOSP PERFORMED
Amphetamines: NOT DETECTED
Barbiturates: NOT DETECTED
Benzodiazepines: POSITIVE — AB
COCAINE: NOT DETECTED
OPIATES: NOT DETECTED
Tetrahydrocannabinol: POSITIVE — AB

## 2018-02-13 LAB — CBG MONITORING, ED
GLUCOSE-CAPILLARY: 338 mg/dL — AB (ref 70–99)
GLUCOSE-CAPILLARY: 434 mg/dL — AB (ref 70–99)
Glucose-Capillary: 290 mg/dL — ABNORMAL HIGH (ref 70–99)

## 2018-02-13 LAB — I-STAT CG4 LACTIC ACID, ED
Lactic Acid, Venous: 2.02 mmol/L (ref 0.5–1.9)
Lactic Acid, Venous: 3.83 mmol/L (ref 0.5–1.9)

## 2018-02-13 LAB — BLOOD GAS, ARTERIAL
Drawn by: 225631
Drawn by: 232811
FIO2: 21
FIO2: 21
O2 SAT: 97.3 %
O2 SAT: 97 %
PH ART: 6.982 — AB (ref 7.350–7.450)
PH ART: 6.943 — AB (ref 7.350–7.450)
PO2 ART: 130 mmHg — AB (ref 83.0–108.0)
Patient temperature: 98.1
Patient temperature: 98.6
pO2, Arterial: 137 mmHg — ABNORMAL HIGH (ref 83.0–108.0)

## 2018-02-13 LAB — LIPASE, BLOOD: Lipase: 1125 U/L — ABNORMAL HIGH (ref 11–51)

## 2018-02-13 LAB — HEPATIC FUNCTION PANEL
ALK PHOS: 92 U/L (ref 38–126)
ALT: 188 U/L — ABNORMAL HIGH (ref 0–44)
AST: 164 U/L — ABNORMAL HIGH (ref 15–41)
Albumin: 4.4 g/dL (ref 3.5–5.0)
Bilirubin, Direct: 0.3 mg/dL — ABNORMAL HIGH (ref 0.0–0.2)
Indirect Bilirubin: 1.6 mg/dL — ABNORMAL HIGH (ref 0.3–0.9)
TOTAL PROTEIN: 7.1 g/dL (ref 6.5–8.1)
Total Bilirubin: 1.9 mg/dL — ABNORMAL HIGH (ref 0.3–1.2)

## 2018-02-13 LAB — SALICYLATE LEVEL

## 2018-02-13 LAB — HIV ANTIBODY (ROUTINE TESTING W REFLEX): HIV Screen 4th Generation wRfx: NONREACTIVE

## 2018-02-13 LAB — TRIGLYCERIDES: TRIGLYCERIDES: 442 mg/dL — AB (ref <150)

## 2018-02-13 LAB — PHOSPHORUS: Phosphorus: 1.4 mg/dL — ABNORMAL LOW (ref 2.5–4.6)

## 2018-02-13 LAB — URINALYSIS, ROUTINE W REFLEX MICROSCOPIC
Bilirubin Urine: NEGATIVE
KETONES UR: 80 mg/dL — AB
Leukocytes, UA: NEGATIVE
NITRITE: NEGATIVE
PH: 5 (ref 5.0–8.0)
Specific Gravity, Urine: 1.016 (ref 1.005–1.030)

## 2018-02-13 LAB — LACTIC ACID, PLASMA: LACTIC ACID, VENOUS: 0.9 mmol/L (ref 0.5–1.9)

## 2018-02-13 LAB — TROPONIN I

## 2018-02-13 LAB — ACETAMINOPHEN LEVEL

## 2018-02-13 LAB — HEMOGLOBIN A1C
Hgb A1c MFr Bld: 8.4 % — ABNORMAL HIGH (ref 4.8–5.6)
MEAN PLASMA GLUCOSE: 194.38 mg/dL

## 2018-02-13 LAB — CK: Total CK: 74 U/L (ref 49–397)

## 2018-02-13 LAB — I-STAT TROPONIN, ED: Troponin i, poc: 0 ng/mL (ref 0.00–0.08)

## 2018-02-13 LAB — MRSA PCR SCREENING: MRSA BY PCR: NEGATIVE

## 2018-02-13 LAB — MAGNESIUM: Magnesium: 1.8 mg/dL (ref 1.7–2.4)

## 2018-02-13 MED ORDER — LACTATED RINGERS IV BOLUS
2000.0000 mL | Freq: Once | INTRAVENOUS | Status: AC
  Administered 2018-02-13: 2000 mL via INTRAVENOUS

## 2018-02-13 MED ORDER — ONDANSETRON HCL 4 MG/2ML IJ SOLN
4.0000 mg | Freq: Three times a day (TID) | INTRAMUSCULAR | Status: DC | PRN
  Administered 2018-02-13 – 2018-02-15 (×4): 4 mg via INTRAVENOUS
  Filled 2018-02-13 (×4): qty 2

## 2018-02-13 MED ORDER — LACTATED RINGERS IV SOLN
INTRAVENOUS | Status: DC
  Administered 2018-02-13: 06:00:00 via INTRAVENOUS

## 2018-02-13 MED ORDER — NICOTINE 14 MG/24HR TD PT24
14.0000 mg | MEDICATED_PATCH | Freq: Every day | TRANSDERMAL | Status: DC
  Administered 2018-02-13 – 2018-02-15 (×4): 14 mg via TRANSDERMAL
  Filled 2018-02-13 (×4): qty 1

## 2018-02-13 MED ORDER — FAMOTIDINE IN NACL 20-0.9 MG/50ML-% IV SOLN
20.0000 mg | Freq: Two times a day (BID) | INTRAVENOUS | Status: DC
  Administered 2018-02-13 – 2018-02-14 (×4): 20 mg via INTRAVENOUS
  Filled 2018-02-13 (×6): qty 50

## 2018-02-13 MED ORDER — HYDROMORPHONE HCL 1 MG/ML IJ SOLN
1.0000 mg | INTRAMUSCULAR | Status: DC | PRN
  Administered 2018-02-13: 1 mg via INTRAVENOUS
  Filled 2018-02-13: qty 1

## 2018-02-13 MED ORDER — POTASSIUM CHLORIDE CRYS ER 20 MEQ PO TBCR
40.0000 meq | EXTENDED_RELEASE_TABLET | Freq: Two times a day (BID) | ORAL | Status: AC
  Administered 2018-02-13 (×2): 40 meq via ORAL
  Filled 2018-02-13 (×2): qty 2

## 2018-02-13 MED ORDER — DEXTROSE-NACL 5-0.45 % IV SOLN
INTRAVENOUS | Status: DC
  Administered 2018-02-13: 08:00:00 via INTRAVENOUS

## 2018-02-13 MED ORDER — INSULIN REGULAR(HUMAN) IN NACL 100-0.9 UT/100ML-% IV SOLN
INTRAVENOUS | Status: DC
  Administered 2018-02-13: 0.7 [IU]/h via INTRAVENOUS
  Filled 2018-02-13: qty 100

## 2018-02-13 MED ORDER — POTASSIUM PHOSPHATES 15 MMOLE/5ML IV SOLN
30.0000 mmol | Freq: Once | INTRAVENOUS | Status: AC
  Administered 2018-02-13: 30 mmol via INTRAVENOUS
  Filled 2018-02-13: qty 10

## 2018-02-13 MED ORDER — POTASSIUM CHLORIDE 10 MEQ/100ML IV SOLN
10.0000 meq | INTRAVENOUS | Status: AC
  Administered 2018-02-14 (×3): 10 meq via INTRAVENOUS
  Filled 2018-02-13 (×3): qty 100

## 2018-02-13 MED ORDER — LORAZEPAM 2 MG/ML IJ SOLN
2.0000 mg | INTRAMUSCULAR | Status: DC | PRN
  Administered 2018-02-13 – 2018-02-15 (×5): 2 mg via INTRAVENOUS
  Filled 2018-02-13 (×5): qty 1

## 2018-02-13 MED ORDER — LORAZEPAM 2 MG/ML IJ SOLN
1.0000 mg | Freq: Once | INTRAMUSCULAR | Status: AC
  Administered 2018-02-13: 1 mg via INTRAVENOUS
  Filled 2018-02-13: qty 1

## 2018-02-13 MED ORDER — HEPARIN SODIUM (PORCINE) 5000 UNIT/ML IJ SOLN: 5000.0000 [IU] | Freq: Three times a day (TID) | INTRAMUSCULAR | Status: DC

## 2018-02-13 MED ORDER — THIAMINE HCL 100 MG/ML IJ SOLN
100.0000 mg | Freq: Every day | INTRAMUSCULAR | Status: DC
  Administered 2018-02-13 – 2018-02-15 (×3): 100 mg via INTRAVENOUS
  Filled 2018-02-13 (×3): qty 2

## 2018-02-13 MED ORDER — MORPHINE SULFATE (PF) 2 MG/ML IV SOLN
2.0000 mg | INTRAVENOUS | Status: DC | PRN
  Administered 2018-02-13 – 2018-02-14 (×4): 2 mg via INTRAVENOUS
  Filled 2018-02-13 (×4): qty 1

## 2018-02-13 MED ORDER — INSULIN DETEMIR 100 UNIT/ML ~~LOC~~ SOLN
5.0000 [IU] | Freq: Two times a day (BID) | SUBCUTANEOUS | Status: DC
  Administered 2018-02-13 (×2): 5 [IU] via SUBCUTANEOUS
  Filled 2018-02-13 (×2): qty 0.05

## 2018-02-13 MED ORDER — ORAL CARE MOUTH RINSE
15.0000 mL | Freq: Two times a day (BID) | OROMUCOSAL | Status: DC
  Administered 2018-02-13 – 2018-02-14 (×3): 15 mL via OROMUCOSAL

## 2018-02-13 MED ORDER — DEXTROSE IN LACTATED RINGERS 5 % IV SOLN: INTRAVENOUS | Status: DC

## 2018-02-13 MED ORDER — SODIUM PHOSPHATES 45 MMOLE/15ML IV SOLN
30.0000 mmol | Freq: Once | INTRAVENOUS | Status: DC
  Filled 2018-02-13: qty 10

## 2018-02-13 MED ORDER — IOHEXOL 300 MG/ML  SOLN
30.0000 mL | Freq: Once | INTRAMUSCULAR | Status: AC | PRN
  Administered 2018-02-13: 30 mL via INTRAVENOUS

## 2018-02-13 MED ORDER — DEXTROSE 5 % IV SOLN: INTRAVENOUS | Status: DC

## 2018-02-13 MED ORDER — SODIUM BICARBONATE 8.4 % IV SOLN
50.0000 meq | Freq: Once | INTRAVENOUS | Status: AC
  Administered 2018-02-13: 50 meq via INTRAVENOUS
  Filled 2018-02-13: qty 50

## 2018-02-13 MED ORDER — INSULIN ASPART 100 UNIT/ML ~~LOC~~ SOLN
0.0000 [IU] | SUBCUTANEOUS | Status: DC
  Administered 2018-02-14: 1 [IU] via SUBCUTANEOUS
  Administered 2018-02-14: 3 [IU] via SUBCUTANEOUS
  Administered 2018-02-14: 2 [IU] via SUBCUTANEOUS
  Administered 2018-02-15: 1 [IU] via SUBCUTANEOUS

## 2018-02-13 MED ORDER — FOLIC ACID 5 MG/ML IJ SOLN
1.0000 mg | Freq: Every day | INTRAMUSCULAR | Status: DC
  Administered 2018-02-13 – 2018-02-14 (×2): 1 mg via INTRAVENOUS
  Filled 2018-02-13 (×3): qty 0.2

## 2018-02-13 MED ORDER — SODIUM CHLORIDE 0.9 % IV BOLUS
2000.0000 mL | Freq: Once | INTRAVENOUS | Status: AC
  Administered 2018-02-13: 2000 mL via INTRAVENOUS

## 2018-02-13 MED ORDER — LACTATED RINGERS IV SOLN
INTRAVENOUS | Status: DC
  Administered 2018-02-14: 01:00:00 via INTRAVENOUS

## 2018-02-13 MED ORDER — DEXTROSE IN LACTATED RINGERS 5 % IV SOLN
INTRAVENOUS | Status: DC
  Administered 2018-02-13: 10:00:00 via INTRAVENOUS

## 2018-02-13 MED ORDER — SODIUM CHLORIDE 0.9 % IV SOLN: INTRAVENOUS | Status: DC

## 2018-02-13 NOTE — Progress Notes (Signed)
Patient states it is okay to share all information regarding his care and the plan of care with his Mother, Dorr Perrot, at [phone]. Called Consuella Lose to give her an update.

## 2018-02-13 NOTE — Progress Notes (Signed)
[facility] PROGRESS NOTE    Progress Note  [name] Wall  ZOX:[id number] DOB: 1990/09/12 DOA: 02/12/2018 PCP: Heide Scales, PA-C     Brief Narrative:   [name] Wall is an 27 y.o. male past medical history of diabetes mellitus type 2, alcohol abuse presents to the ER with severe chest pain nausea and vomiting 2 days ago with pain radiating to his back he drinks alcohol every day.  Assessment/Plan:   Diabetic ketoacidosis without coma associated with type 1 diabetes mellitus (HCC) Multifactorial likely due to noncompliance with his insulin, alcoholic ketosis starvation ketosis. He was started on IV insulin and IV fluids. He is about 5 L positive.  Eyes and are not accurate. He still has high anion gap, his bicarbonate is less than 7. We will continue IV insulin and D5 1/2 normal saline. Continue CBGs q. one hour and basic metabolic panels every 4 hours.  Acute alcoholic pancreatitis: Likely due to alcohol abuse. UDS positive for benzodiazepines and marijuana. Keep him n.p.o. continue IV fluids and narcotics for pain. He is oversedated can barely keep his eyes open and will discontinue IV dilaudid, will use morphine IV PRN.  Alcohol abuse with physiological dependence (HCC) Last drink was today prior to coming in. Continue thiamine and folate. Continue to monitor.  Elevated LFTs: Likely alcohol abuse. His physical exam does not have a Murphy sign, LFTs are trending down. D/c CT scan of the abdomen and pelvis, as his alkaline phosphatase is 112.   DVT prophylaxis: lovenxo Family Communication:none Disposition Plan/Barrier to D/C: keep in SDU Code Status:     Code Status Orders  (From admission, onward)         Start     Ordered   02/13/18 0541  Full code  Continuous     02/13/18 0542        Code Status History    This patient has a current code status but no historical code status.        IV Access:    Peripheral  IV   Procedures and diagnostic studies:   Dg Chest Port 1 View  Result Date: 02/13/2018 CLINICAL DATA:  Shortness of breath EXAM: PORTABLE CHEST 1 VIEW COMPARISON:  06/20/2017 FINDINGS: The heart size and mediastinal contours are within normal limits. Both lungs are clear. The visualized skeletal structures are unremarkable. IMPRESSION: No active disease. Electronically Signed   By: Deatra Robinson M.D.   On: 02/13/2018 03:51     Medical Consultants:    None.  Anti-Infectives:    Subjective:    Theora Gianotti he is very sleepy, can barely keep his eyes open.  Objective:    Vitals:   02/13/18 0330 02/13/18 0440 02/13/18 0500 02/13/18 0600  BP: (!) 134/92  (!) 139/92 129/82  Pulse: (!) 121 (!) 125 (!) 124 (!) 122  Resp: 20  13 15   Temp:      TempSrc:      SpO2: 100%  100% 100%  Weight:      Height:        Intake/Output Summary (Last 24 hours) at 02/13/2018 0821 Last data filed at 02/13/2018 0526 Gross per 24 hour  Intake 6214.72 ml  Output 975 ml  Net 5239.72 ml   Filed Weights   02/12/18 1852  Weight: 74.8 kg    Exam: General exam: In no acute distress. Respiratory system: Good air movement and clear to auscultation. Cardiovascular system: S1 & S2 heard, RRR.  Gastrointestinal system: Abdomen is  nondistended, soft and nontender.  Central nervous system: Alert and oriented. No focal neurological deficits. Extremities: No pedal edema. Skin: No rashes, lesions or ulcers Psychiatry: Judgement and insight appear normal. Mood & affect appropriate.  He is responsive to sternal rub and is able to carry on a conversation after noxious stimuli.   Data Reviewed:    Labs: Basic Metabolic Panel: Recent Labs  Lab 02/12/18 1853 02/13/18 0541  NA 134* 139  K 4.4 4.3  CL 100 112*  CO2 <7* <7*  GLUCOSE 351* 149*  BUN 11 10  CREATININE 0.85 0.79  CALCIUM 8.8* 7.3*  MG  --  1.8  PHOS  --  1.4*   GFR Estimated Creatinine Clearance: 145.7 mL/min (by C-G  formula based on SCr of 0.79 mg/dL). Liver Function Tests: Recent Labs  Lab 02/12/18 1853 02/13/18 0541  AST 232* 164*  ALT 232* 188*  ALKPHOS 112 92  BILITOT 2.2* 1.9*  PROT 8.4* 7.1  ALBUMIN 5.2* 4.4   Recent Labs  Lab 02/13/18 0019  LIPASE 1,125*   No results for input(s): AMMONIA in the last 168 hours. Coagulation profile No results for input(s): INR, PROTIME in the last 168 hours.  CBC: Recent Labs  Lab 02/12/18 1853  WBC 8.7  HGB 15.7  HCT 47.7  MCV 95.6  PLT 268   Cardiac Enzymes: Recent Labs  Lab 02/13/18 0541 02/13/18 0611  CKTOTAL  --  74  TROPONINI <0.03  --    BNP (last 3 results) No results for input(s): PROBNP in the last 8760 hours. CBG: Recent Labs  Lab 02/13/18 0253 02/13/18 0414 02/13/18 0524 02/13/18 0647 02/13/18 0753  GLUCAP 290* 189* 154* 125* 142*   D-Dimer: No results for input(s): DDIMER in the last 72 hours. Hgb A1c: Recent Labs    02/13/18 0543  HGBA1C 8.4*   Lipid Profile: Recent Labs    02/13/18 0611  TRIG 442*   Thyroid function studies: No results for input(s): TSH, T4TOTAL, T3FREE, THYROIDAB in the last 72 hours.  Invalid input(s): FREET3 Anemia work up: No results for input(s): VITAMINB12, FOLATE, FERRITIN, TIBC, IRON, RETICCTPCT in the last 72 hours. Sepsis Labs: Recent Labs  Lab 02/12/18 1853 02/13/18 0031 02/13/18 0153 02/13/18 0544  WBC 8.7  --   --   --   LATICACIDVEN  --  3.83* 2.02* 0.9   Microbiology Recent Results (from the past 240 hour(s))  MRSA PCR Screening     Status: None   Collection Time: 02/13/18  4:30 AM  Result Value Ref Range Status   MRSA by PCR NEGATIVE NEGATIVE Final    Comment:        The GeneXpert MRSA Assay (FDA approved for NASAL specimens only), is one component of a comprehensive MRSA colonization surveillance program. It is not intended to diagnose MRSA infection nor to guide or monitor treatment for MRSA infections. Performed at [facility], [address], [place], [place] [address]      Medications:   . folic acid  1 mg Intravenous Daily  . mouth rinse  15 mL Mouth Rinse BID  . thiamine injection  100 mg Intravenous Daily   Continuous Infusions: . dextrose 5 % and 0.45% NaCl 125 mL/hr at 02/13/18 0802  . famotidine (PEPCID) IV Stopped (02/13/18 0843)  . insulin 0.8 Units/hr (02/13/18 0758)  . lactated ringers 200 mL/hr at 02/13/18 0626      LOS: 0 days   Marinda Elk  [facility]   *  Please refer to [email or web address], password [email or web address] to get updated schedule on who will round on this patient, as hospitalists switch teams weekly. If 7PM-7AM, please contact night-coverage at [email or web address], password [email or web address] for any overnight needs.  02/13/2018, 8:21 AM

## 2018-02-13 NOTE — Progress Notes (Signed)
Inpatient Diabetes Program Recommendations  AACE/ADA: New Consensus Statement on Inpatient Glycemic Control (2015)  Target Ranges:  Prepandial:   less than 140 mg/dL      Peak postprandial:   less than 180 mg/dL (1-2 hours)      Critically ill patients:  140 - 180 mg/dL   Lab Results  Component Value Date   GLUCAP 188 (H) 02/13/2018   HGBA1C 8.4 (H) 02/13/2018    Spoke with patient at bedside. Patient sees Dr. Everardo All, Endocrinologist, outpatient for DM management. Last visit was in June. Patient reports having an appointment nest week.   Discussed with patient sick day guidelines, checking glucose frequently and still taking insulin. Attached sick day guidelines for d/c.  A1c 8.4% this admission.  Will check back in with patient tomorrow as he gave conflicting information about taking his insulin.  Thanks, Christena Deem RN, MSN, BC-ADM Inpatient Diabetes Coordinator Team Pager [phone] (8a-5p)

## 2018-02-13 NOTE — ED Notes (Signed)
Consuella Lose (mom) phone # [phone]

## 2018-02-13 NOTE — Consult Note (Signed)
NAME:  [name] Wall, MRN:  [id number], DOB:  1990-11-24, LOS: 0 ADMISSION DATE:  02/12/2018, CONSULTATION DATE:  11/7REFERRING MD:  Robb Matar, CHIEF COMPLAINT:  DKA w/ severe metabolic derangements    Brief History   27 year old male patient admitted with working diagnosis of DKA, and acute alcoholic pancreatitis on 11/7.  Critical care asked to assist with management given profound metabolic acidosis  Past Medical History  Diabetes, bipolar disorder, pancreatitis, alcohol abuse Significant Hospital Events   11/6: Presented to the emergency room with chief complaint of diffuse abdominal pain, nausea vomiting for 2 days, inability to tolerate p.o. intake and inability to take insulin.  Bicarbonate less than 7, glucose 351, Lipase 1125, UDS positive for benzodiazepines and THC, lactic acid initially 2.02 clear to 0.9 following fluid resuscitation.  Started on IV fluids and IV insulin 11/7: still w/ marked ag metabolic acidosis. PCCM asked to a/w care   Consults: date of consult/date signed off & final recs:  Critical care consulted 11/7  Procedures (surgical and bedside):    Significant Diagnostic Tests:    Micro Data:  MRSA screen 11/7: Negative  Antimicrobials:    Subjective:  Pain is a little better  Objective   Blood pressure 131/63, pulse (Abnormal) 110, temperature 97.8 F (36.6 C), temperature source Oral, resp. rate 17, height 5\' 10"  (1.778 m), weight 74.8 kg, SpO2 100 %.        Intake/Output Summary (Last 24 hours) at 02/13/2018 0858 Last data filed at 02/13/2018 0843 Gross per 24 hour  Intake 7692.53 ml  Output 1825 ml  Net 5867.53 ml   Filed Weights   02/12/18 1852  Weight: 74.8 kg    Examination: General: 27 year old white male currently resting in bed he is in no acute distress HENT: Normocephalic atraumatic no jugular venous distention Lungs: Clear to auscultation no accessory use Cardiovascular: Regular rate and rhythm without murmur rub or  gallop Abdomen: Soft, tender to palpation, no organomegaly.  Positive bowel sounds Extremities: Brisk capillary refill warm dry no edema Neuro: Awake alert oriented no focal deficits GU: Voids spontaneously clear yellow urine  Resolved Hospital Problem list        Assessment & Plan:   Acute pancreatitis  -presume ETOH related.  Plan NPO F/u am lipase Careful w// analgesia CT imaging per IM service (holding for now)  DKA; now w/ mix of both AG and evolving NAGMA (Delta ratio < 1) Suspect that this is d/t NaCl resuscitation. Delta  -triggered by not taking insulin AND acute pancreatitis Plan Cont DKA protocol w/ IV insulin  NPO Change MIVF to D5LR Cont serial chemistries  Poorly controlled DM (A1c 8.4) Plan DKA protocol as above ssi once gap closed   Elevated LFTs->trending down  Plan Trend LFTs  H/o ETOH abuse -currently calm.  Plan CIWA protocol  Disposition / Summary of Today's Plan 02/13/18    Critically ill due to profound metabolic derangements.  Close monitoring of blood chemistries, acid base and titration of insulin as well as IVFs needed.     Diet: 11/7: N.p.o. Pain/Anxiety/Delirium protocol (if indicated): Not indicated VAP protocol (if indicated): Not indicated DVT prophylaxis: SCD 11/7 until CT abdomen pelvis completed GI prophylaxis: Not indicated Hyperglycemia protocol: DKA protocol 11/7 Mobility: Advance as tolerated Code Status: Full code Family Communication: Pending  Labs   CBC: Recent Labs  Lab 02/12/18 1853  WBC 8.7  HGB 15.7  HCT 47.7  MCV 95.6  PLT 268    Basic Metabolic Panel: Recent Labs  Lab 02/12/18 1853 02/13/18 0541  NA 134* 139  K 4.4 4.3  CL 100 112*  CO2 <7* <7*  GLUCOSE 351* 149*  BUN 11 10  CREATININE 0.85 0.79  CALCIUM 8.8* 7.3*  MG  --  1.8  PHOS  --  1.4*   GFR: Estimated Creatinine Clearance: 145.7 mL/min (by C-G formula based on SCr of 0.79 mg/dL). Recent Labs  Lab 02/12/18 1853  02/13/18 0031 02/13/18 0153 02/13/18 0544  WBC 8.7  --   --   --   LATICACIDVEN  --  3.83* 2.02* 0.9    Liver Function Tests: Recent Labs  Lab 02/12/18 1853 02/13/18 0541  AST 232* 164*  ALT 232* 188*  ALKPHOS 112 92  BILITOT 2.2* 1.9*  PROT 8.4* 7.1  ALBUMIN 5.2* 4.4   Recent Labs  Lab 02/13/18 0019  LIPASE 1,125*   No results for input(s): AMMONIA in the last 168 hours.  ABG    Component Value Date/Time   PHART 6.982 (LL) 02/13/2018 0210   PCO2ART VALUE BELOW REPORTABLE RANGE 02/13/2018 0210   PO2ART 130 (H) 02/13/2018 0210   HCO3 22.2 06/29/2016 2217   TCO2 14 06/29/2016 2201   ACIDBASEDEF 4.4 (H) 06/29/2016 2217   O2SAT 97.3 02/13/2018 0210     Coagulation Profile: No results for input(s): INR, PROTIME in the last 168 hours.  Cardiac Enzymes: Recent Labs  Lab 02/13/18 0541 02/13/18 0611  CKTOTAL  --  74  TROPONINI <0.03  --     HbA1C: Hemoglobin A1C  Date/Time Value Ref Range Status  09/10/2017 10:22 AM 9.9 (A) 4.0 - 5.6 % Final  03/14/2016 01:42 PM 10.1  Final   Hgb A1c MFr Bld  Date/Time Value Ref Range Status  02/13/2018 05:43 AM 8.4 (H) 4.8 - 5.6 % Final    Comment:    (NOTE) Pre diabetes:          5.7%-6.4% Diabetes:              >6.4% Glycemic control for   <7.0% adults with diabetes     CBG: Recent Labs  Lab 02/13/18 0414 02/13/18 0524 02/13/18 0647 02/13/18 0753 02/13/18 0853  GLUCAP 189* 154* 125* 142* 181*    Admitting History of Present Illness.    This is a 27 year old male patient with a history of diabetes type 1, alcohol abuse, and attention deficit disorder he presented to the emergency room on 11/6 with chief complaint of 2 days of severe abdominal pain, chest pain, nausea and vomiting.  Has not been able to take his insulin for the last 2 days due to persistent vomiting.  He does drink alcohol daily.  No blood in vomit.  Reports his pain to be diffuse, mostly epigastric but radiates to the chest, he denies  shortness of breath, fever, or chills.  In the ER he was found to have a lactic acid of 3.8 this improved with fluid challenge, his lipase is 1125, his LFTs were elevated, he had an anion gap metabolic acidosis with unrecordable bicarbonate he was admitted with a working diagnosis of acute alcoholic pancreatitis, as well as diabetic ketoacidosis, with anion gap acidosis perhaps further complicated by alcoholic ketoacidosis and lactic acidosis.  To date IV fluids have been initiated, IV insulin has been started, critical care asked to see and assist with management.  Review of Systems:   Review of Systems - History obtained from chart review and the patient General ROS: positive for  - fatigue negative for -  chills, fever, hot flashes, malaise, sleep disturbance, weight gain or weight loss Psychological ROS: negative ENT ROS: negative Allergy and Immunology ROS: negative Endocrine ROS: positive for - malaise/lethargy and polydipsia/polyuria Respiratory ROS: no cough, shortness of breath, or wheezing Cardiovascular ROS: no chest pain or dyspnea on exertion Gastrointestinal ROS: positive for - abdominal pain, heartburn and nausea/vomiting Genito-Urinary ROS: no dysuria, trouble voiding, or hematuria Musculoskeletal ROS: negative Neurological ROS: no TIA or stroke symptoms  Past Medical History  He,  has a past medical history of ADD (attention deficit disorder), Bipolar disorder (HCC), Diabetes mellitus without complication (HCC), and Pancreatitis.   Surgical History    Past Surgical History:  Procedure Laterality Date  . Arm surgery    . FINGER SURGERY       Social History   Social History   Socioeconomic History  . Marital status: Single    Spouse name: Not on file  . Number of children: Not on file  . Years of education: Not on file  . Highest education level: Not on file  Occupational History  . Not on file  Social Needs  . Financial resource strain: Not on file  . Food  insecurity:    Worry: Not on file    Inability: Not on file  . Transportation needs:    Medical: Not on file    Non-medical: Not on file  Tobacco Use  . Smoking status: Current Every Day Smoker    Packs/day: 0.50    Types: Cigarettes  . Smokeless tobacco: Former Engineer, water and Sexual Activity  . Alcohol use: Yes    Comment: pt states half of a fifth/day  . Drug use: No  . Sexual activity: Not on file  Lifestyle  . Physical activity:    Days per week: Not on file    Minutes per session: Not on file  . Stress: Not on file  Relationships  . Social connections:    Talks on phone: Not on file    Gets together: Not on file    Attends religious service: Not on file    Active member of club or organization: Not on file    Attends meetings of clubs or organizations: Not on file    Relationship status: Not on file  . Intimate partner violence:    Fear of current or ex partner: Not on file    Emotionally abused: Not on file    Physically abused: Not on file    Forced sexual activity: Not on file  Other Topics Concern  . Not on file  Social History Narrative  . Not on file  ,  reports that he has been smoking cigarettes. He has been smoking about 0.50 packs per day. He has quit using smokeless tobacco. He reports that he drinks alcohol. He reports that he does not use drugs.   Family History   His family history includes CAD in his other; Cancer in his other; Diabetes in his father.   Allergies No Known Allergies   Home Medications  Prior to Admission medications   Medication Sig Start Date End Date Taking? Authorizing Provider  amphetamine-dextroamphetamine (ADDERALL) 30 MG tablet Take 30 mg by mouth 2 (two) times daily.     Yes [provider]  insulin degludec (TRESIBA FLEXTOUCH) 100 UNIT/ML SOPN FlexTouch Pen Inject 0.5 mLs (50 Units total) into the skin daily. 09/10/17  Yes Romero Belling, MD  NOVOLOG FLEXPEN 100 UNIT/ML FlexPen INJECT 6 UNITS INTO THE SKIN 3  (  THREE) TIMES DAILY WITH MEALS. AND PEN NEEDLES 4/DAY Patient taking differently: Inject 10 Units into the skin 3 (three) times daily with meals.  10/30/17  Yes Romero Belling, MD  glucose blood (ACCU-CHEK GUIDE) test strip Used to check blood sugars 4x daily. 09/13/17   Romero Belling, MD  nicotine (NICODERM CQ - DOSED IN MG/24 HOURS) 21 mg/24hr patch Place 1 patch (21 mg total) onto the skin daily. Patient not taking: Reported on 02/13/2018 02/13/16   Romero Belling, MD     Critical care time: 31m     Simonne Martinet ACNP-BC [facility] Pulmonary/Critical Care Pager # [phone] OR # [phone] if no answer

## 2018-02-13 NOTE — ED Notes (Signed)
ED TO INPATIENT HANDOFF REPORT  Name/Age/Gender [name] Wall 27 y.o. male  Code Status   Home/SNF/Other Home  Chief Complaint Alcohol Problem  Level of Care/Admitting Diagnosis ED Disposition    ED Disposition Condition [facility] Area: [place] [[id number]]  Level of Care: Stepdown [14]  Admit to SDU based on following criteria: Severe physiological/psychological symptoms:  Any diagnosis requiring assessment & intervention at least every 4 hours on an ongoing basis to obtain desired patient outcomes including stability and rehabilitation  Diagnosis: DKA (diabetic ketoacidoses) [facility]) [154008]  Admitting Physician: Rise Patience [phone]  Attending Physician: Rise Patience [phone]  Estimated length of stay: past midnight tomorrow  Certification:: I certify this patient will need inpatient services for at least 2 midnights  PT Class (Do Not Modify): Inpatient [101]  PT Acc Code (Do Not Modify): Private [1]       Medical History Past Medical History:  Diagnosis Date  . ADD (attention deficit disorder)   . Bipolar disorder ([place])   . Diabetes mellitus without complication ([place])   . Pancreatitis     Allergies No Known Allergies  IV Location/Drains/Wounds Patient Lines/Drains/Airways Status   Active Line/Drains/Airways    Name:   Placement date:   Placement time:   Site:   Days:   Peripheral IV 02/13/18 Left Antecubital   02/13/18    0015    Antecubital   less than 1   Peripheral IV 02/13/18 Right Wrist   02/13/18    0025    Wrist   less than 1          Labs/Imaging Results for orders placed or performed during the hospital encounter of 02/12/18 (from the past 48 hour(s))  Comprehensive metabolic panel     Status: Abnormal   Collection Time: 02/12/18  6:53 PM  Result Value Ref Range   Sodium 134 (L) 135 - 145 mmol/L   Potassium 4.4 3.5 - 5.1 mmol/L   Chloride 100 98 - 111 mmol/L   CO2 <7 (L) 22 - 32 mmol/L   Comment: REPEATED TO VERIFY   Glucose, Bld 351 (H) 70 - 99 mg/dL   BUN 11 6 - 20 mg/dL   Creatinine, Ser 0.85 0.61 - 1.24 mg/dL   Calcium 8.8 (L) 8.9 - 10.3 mg/dL   Total Protein 8.4 (H) 6.5 - 8.1 g/dL   Albumin 5.2 (H) 3.5 - 5.0 g/dL   AST 232 (H) 15 - 41 U/L   ALT 232 (H) 0 - 44 U/L   Alkaline Phosphatase 112 38 - 126 U/L   Total Bilirubin 2.2 (H) 0.3 - 1.2 mg/dL   GFR calc non Af Amer >60 >60 mL/min   GFR calc Af Amer >60 >60 mL/min    Comment: (NOTE) The eGFR has been calculated using the CKD EPI equation. This calculation has not been validated in all clinical situations. eGFR's persistently <60 mL/min signify possible Chronic Kidney Disease.    Anion gap NOT CALCULATED 5 - 15    Comment: Performed at [facility], [place] [address], [place], [place] [address]  Ethanol     Status: None   Collection Time: 02/12/18  6:53 PM  Result Value Ref Range   Alcohol, Ethyl (B) <10 <10 mg/dL    Comment: (NOTE) Lowest detectable limit for serum alcohol is 10 mg/dL. For medical purposes only. Performed at [facility], [place] [address], [place], [place] [address]   cbc  Status: None   Collection Time: 02/12/18  6:53 PM  Result Value Ref Range   WBC 8.7 4.0 - 10.5 K/uL   RBC 4.99 4.22 - 5.81 MIL/uL   Hemoglobin 15.7 13.0 - 17.0 g/dL   HCT 47.7 39.0 - 52.0 %   MCV 95.6 80.0 - 100.0 fL   MCH 31.5 26.0 - 34.0 pg   MCHC 32.9 30.0 - 36.0 g/dL   RDW 11.7 11.5 - 15.5 %   Platelets 268 150 - 400 K/uL   nRBC 0.0 0.0 - 0.2 %    Comment: Performed at [facility], [place] [address] [place], [place] [address]  Blood gas, arterial     Status: Abnormal   Collection Time: 02/12/18 11:35 PM  Result Value Ref Range   FIO2 21.00    Delivery systems ROOM AIR    pH, Arterial 6.943 (LL) 7.350 - 7.450    Comment: CRITICAL RESULT CALLED TO, READ BACK BY AND VERIFIED WITH: KELLY HUMES, PA AT 2342 BY ANNALISSA AGUSTIN,RRT,RCP ON 02/12/18    pCO2  arterial VALUE BELOW REPORTABLE RANGE 32.0 - 48.0 mmHg    Comment: CRITICAL RESULT CALLED TO, READ BACK BY AND VERIFIED WITH: KELLY HUMES, PA AT 2342 BY ANNALISSA AGUSTIN,RRT,RCP ON 02/12/18    pO2, Arterial 137 (H) 83.0 - 108.0 mmHg   O2 Saturation 97.0 %   Patient temperature 98.1    Collection site RIGHT RADIAL    Drawn by [id number]    Sample type ARTERIAL    Allens test (pass/fail) PASS PASS    Comment: Performed at [facility], [place] [address] [place], [place] [address]  Lipase, blood     Status: Abnormal   Collection Time: 02/13/18 12:19 AM  Result Value Ref Range   Lipase 1,125 (H) 11 - 51 U/L    Comment: RESULTS CONFIRMED BY MANUAL DILUTION Performed at [facility], [place] [address], [place], [place] [address]   Rapid urine drug screen (hospital performed)     Status: Abnormal   Collection Time: 02/13/18 12:24 AM  Result Value Ref Range   Opiates NONE DETECTED NONE DETECTED   Cocaine NONE DETECTED NONE DETECTED   Benzodiazepines POSITIVE (A) NONE DETECTED   Amphetamines NONE DETECTED NONE DETECTED   Tetrahydrocannabinol POSITIVE (A) NONE DETECTED   Barbiturates NONE DETECTED NONE DETECTED    Comment: (NOTE) DRUG SCREEN FOR MEDICAL PURPOSES ONLY.  IF CONFIRMATION IS NEEDED FOR ANY PURPOSE, NOTIFY LAB WITHIN 5 DAYS. LOWEST DETECTABLE LIMITS FOR URINE DRUG SCREEN Drug Class                     Cutoff (ng/mL) Amphetamine and metabolites    1000 Barbiturate and metabolites    200 Benzodiazepine                 094 Tricyclics and metabolites     300 Opiates and metabolites        300 Cocaine and metabolites        300 THC                            50 Performed at [facility], [place] [address], [place], [place] [address]   Urinalysis, Routine w reflex microscopic     Status: Abnormal   Collection Time: 02/13/18 12:24 AM  Result Value Ref Range   Color, Urine YELLOW YELLOW   APPearance CLEAR CLEAR  Specific  Gravity, Urine 1.016 1.005 - 1.030   pH 5.0 5.0 - 8.0   Glucose, UA >=500 (A) NEGATIVE mg/dL   Hgb urine dipstick LARGE (A) NEGATIVE   Bilirubin Urine NEGATIVE NEGATIVE   Ketones, ur 80 (A) NEGATIVE mg/dL   Protein, ur >=300 (A) NEGATIVE mg/dL   Nitrite NEGATIVE NEGATIVE   Leukocytes, UA NEGATIVE NEGATIVE   RBC / HPF >50 (H) 0 - 5 RBC/hpf   WBC, UA 0-5 0 - 5 WBC/hpf   Bacteria, UA RARE (A) NONE SEEN   Squamous Epithelial / LPF 0-5 0 - 5   Mucus PRESENT    Hyaline Casts, UA PRESENT     Comment: Performed at [facility], [place] [address] [place], [place] [address]  I-Stat CG4 Lactic Acid, ED     Status: Abnormal   Collection Time: 02/13/18 12:31 AM  Result Value Ref Range   Lactic Acid, Venous 3.83 (HH) 0.5 - 1.9 mmol/L   Comment NOTIFIED PHYSICIAN   I-stat troponin, ED     Status: None   Collection Time: 02/13/18 12:31 AM  Result Value Ref Range   Troponin i, poc 0.00 0.00 - 0.08 ng/mL   Comment 3            Comment: Due to the release kinetics of cTnI, a negative result within the first hours of the onset of symptoms does not rule out myocardial infarction with certainty. If myocardial infarction is still suspected, repeat the test at appropriate intervals.   CBG monitoring, ED     Status: Abnormal   Collection Time: 02/13/18 12:36 AM  Result Value Ref Range   Glucose-Capillary 434 (H) 70 - 99 mg/dL   Comment 1 Notify RN    Comment 2 Document in Chart   I-Stat CG4 Lactic Acid, ED     Status: Abnormal   Collection Time: 02/13/18  1:53 AM  Result Value Ref Range   Lactic Acid, Venous 2.02 (HH) 0.5 - 1.9 mmol/L   Comment NOTIFIED PHYSICIAN   CBG monitoring, ED     Status: Abnormal   Collection Time: 02/13/18  1:58 AM  Result Value Ref Range   Glucose-Capillary 338 (H) 70 - 99 mg/dL  Blood gas, arterial     Status: Abnormal   Collection Time: 02/13/18  2:10 AM  Result Value Ref Range   FIO2 21.00    Delivery systems ROOM AIR    pH, Arterial 6.982  (LL) 7.350 - 7.450    Comment: CRITICAL RESULT CALLED TO, READ BACK BY AND VERIFIED WITH: DR. Florina Ou, M.D. AT [place], RRT, RCP ON 02/13/2018    pCO2 arterial VALUE BELOW REPORTABLE RANGE 32.0 - 48.0 mmHg    Comment: CRITICAL RESULT CALLED TO, READ BACK BY AND VERIFIED WITH: DR. Florina Ou, M.D. Erika.Bidding BY PATRICK SWEENEY RRT, RCP ON 02/13/2018    pO2, Arterial 130 (H) 83.0 - 108.0 mmHg   O2 Saturation 97.3 %   Patient temperature 98.6    Collection site ARTERIAL DRAW    Drawn by [id number]    Sample type ARTERIAL DRAW    Allens test (pass/fail) PASS PASS    Comment: Performed at [facility], [place] [address], [place], [place] [address]  CBG monitoring, ED     Status: Abnormal   Collection Time: 02/13/18  2:53 AM  Result Value Ref Range   Glucose-Capillary 290 (H) 70 - 99 mg/dL   Comment 1 Notify RN    Comment 2 Document  in Chart    No results found. None  Pending Labs Unresulted Labs (From admission, onward)   None      Vitals/Pain Today's Vitals   02/13/18 0230 02/13/18 0245 02/13/18 0300 02/13/18 0315  BP: (!) 141/94 129/79 (!) 148/97 (!) 141/89  Pulse: (!) 120 (!) 118 (!) 134 (!) 120  Resp: (!) 23 16 (!) 24 (!) 23  Temp:      TempSrc:      SpO2: 100% 100% 100% 100%  Weight:      Height:      PainSc:        Isolation Precautions No active isolations  Medications Medications  insulin regular, human (MYXREDLIN) 100 units/ 100 mL infusion (4.6 Units/hr Intravenous Rate/Dose Change 02/13/18 0327)  dextrose 5 %-0.45 % sodium chloride infusion ( Intravenous Hold 02/13/18 0027)  sodium chloride 0.9 % bolus 2,000 mL (has no administration in time range)  sodium bicarbonate injection 50 mEq (has no administration in time range)  sodium chloride 0.9 % bolus 2,000 mL (0 mLs Intravenous Stopped 02/13/18 0131)  LORazepam (ATIVAN) injection 1 mg (1 mg Intravenous Given 02/13/18 0026)  potassium chloride 10 mEq in 100 mL IVPB (0 mEq Intravenous Stopped  02/13/18 0131)  morphine 4 MG/ML injection 4 mg (4 mg Intravenous Given 02/13/18 0026)  sodium bicarbonate injection 50 mEq (50 mEq Intravenous Given 02/13/18 0043)    Mobility walks

## 2018-02-13 NOTE — H&P (Addendum)
History and Physical    [name] Wall ZOX:[id number] DOB: 08-21-1990 DOA: 02/12/2018  PCP: Heide Scales, PA-C  Patient coming from: Home.  Chief Complaint: Abdominal pain nausea vomiting.  HPI: [name] Wall is a 27 y.o. male with history of diabetes mellitus type 1, ADD and alcohol abuse presents to the ER with complaints of severe abdominal pain chest pain and nausea vomiting for the last 2 days.  Patient states he has not taken his insulin for last 2 days because of persistent vomiting.  Agrees to have taken alcohol every day.  Denies any blood in the vomitus or bowel movement.  Pain in the abdomen is diffuse mostly in the epigastric area radiating to his chest.  Denies any fevers chills or shortness of breath.  Unable to keep in anything.  Patient denies drinking any other type of alcohol other than his regular alcohol and denies taking any salicylates.  ED Course: In the ER patient was very tachycardic with labs showing lactate of 3.8 which improved with fluid bolus to 2.02.  Lipase was 1000 125.  ABG was showing pH of 6.94 after bicarbonate IV bolus and IV fluid boluses did not improve much and repeat on was 6.98.  Metabolic panel showed bicarb unrecordable with an anion gap not calculable.  Urine ketones are positive.  LFTs elevated at AST of 232 ALT of 232 total bilirubin of 2.2.  Critical care was consulted.  Patient started on IV insulin for DKA.  Patient admitted for acute pancreatitis with DKA and alcoholic ketoacidosis.  On my exam patient is not in distress still complains of pain in the abdomen.  Review of Systems: As per HPI, rest all negative.   Past Medical History:  Diagnosis Date  . ADD (attention deficit disorder)   . Bipolar disorder (HCC)   . Diabetes mellitus without complication (HCC)   . Pancreatitis     Past Surgical History:  Procedure Laterality Date  . Arm surgery    . FINGER SURGERY       reports that he has been smoking cigarettes. He  has been smoking about 0.50 packs per day. He has quit using smokeless tobacco. He reports that he drinks alcohol. He reports that he does not use drugs.  No Known Allergies  Family History  Problem Relation Age of Onset  . Diabetes Father   . Cancer Other   . CAD Other     Prior to Admission medications   Medication Sig Start Date End Date Taking? Authorizing Provider  amphetamine-dextroamphetamine (ADDERALL) 30 MG tablet Take 30 mg by mouth 2 (two) times daily.     Yes [provider]  insulin degludec (TRESIBA FLEXTOUCH) 100 UNIT/ML SOPN FlexTouch Pen Inject 0.5 mLs (50 Units total) into the skin daily. 09/10/17  Yes Romero Belling, MD  NOVOLOG FLEXPEN 100 UNIT/ML FlexPen INJECT 6 UNITS INTO THE SKIN 3 (THREE) TIMES DAILY WITH MEALS. AND PEN NEEDLES 4/DAY Patient taking differently: Inject 10 Units into the skin 3 (three) times daily with meals.  10/30/17  Yes Romero Belling, MD  glucose blood (ACCU-CHEK GUIDE) test strip Used to check blood sugars 4x daily. 09/13/17   Romero Belling, MD  nicotine (NICODERM CQ - DOSED IN MG/24 HOURS) 21 mg/24hr patch Place 1 patch (21 mg total) onto the skin daily. Patient not taking: Reported on 02/13/2018 02/13/16   Romero Belling, MD    Physical Exam: Vitals:   02/13/18 0315 02/13/18 0330 02/13/18 0440 02/13/18 0500  BP: Marland Kitchen)  141/89 (!) 134/92  (!) 139/92  Pulse: (!) 120 (!) 121 (!) 125 (!) 124  Resp: (!) 23 20  13   Temp:      TempSrc:      SpO2: 100% 100%  100%  Weight:      Height:          Constitutional: Moderately built and nourished. Vitals:   02/13/18 0315 02/13/18 0330 02/13/18 0440 02/13/18 0500  BP: (!) 141/89 (!) 134/92  (!) 139/92  Pulse: (!) 120 (!) 121 (!) 125 (!) 124  Resp: (!) 23 20  13   Temp:      TempSrc:      SpO2: 100% 100%  100%  Weight:      Height:       Eyes: Anicteric no pallor. ENMT: No discharge from the ears eyes nose or mouth. Neck: No mass or.  No neck rigidity.  No JVD appreciated. Respiratory: No  rhonchi or crepitations. Cardiovascular: S1-S2 heard no murmurs appreciated. Abdomen: Soft mild tenderness no guarding no rigidity no rebound tenderness.  Bowel sounds present. Musculoskeletal: No edema. Skin: No rash. Neurologic: Alert awake oriented to time place and person.  Moves all extremities. Psychiatric: Appears normal.  Normal affect.   Labs on Admission: I have personally reviewed following labs and imaging studies  CBC: Recent Labs  Lab 02/12/18 1853  WBC 8.7  HGB 15.7  HCT 47.7  MCV 95.6  PLT 268   Basic Metabolic Panel: Recent Labs  Lab 02/12/18 1853  NA 134*  K 4.4  CL 100  CO2 <7*  GLUCOSE 351*  BUN 11  CREATININE 0.85  CALCIUM 8.8*   GFR: Estimated Creatinine Clearance: 137.2 mL/min (by C-G formula based on SCr of 0.85 mg/dL). Liver Function Tests: Recent Labs  Lab 02/12/18 1853  AST 232*  ALT 232*  ALKPHOS 112  BILITOT 2.2*  PROT 8.4*  ALBUMIN 5.2*   Recent Labs  Lab 02/13/18 0019  LIPASE 1,125*   No results for input(s): AMMONIA in the last 168 hours. Coagulation Profile: No results for input(s): INR, PROTIME in the last 168 hours. Cardiac Enzymes: No results for input(s): CKTOTAL, CKMB, CKMBINDEX, TROPONINI in the last 168 hours. BNP (last 3 results) No results for input(s): PROBNP in the last 8760 hours. HbA1C: No results for input(s): HGBA1C in the last 72 hours. CBG: Recent Labs  Lab 02/13/18 0036 02/13/18 0158 02/13/18 0253 02/13/18 0414 02/13/18 0524  GLUCAP 434* 338* 290* 189* 154*   Lipid Profile: No results for input(s): CHOL, HDL, LDLCALC, TRIG, CHOLHDL, LDLDIRECT in the last 72 hours. Thyroid Function Tests: No results for input(s): TSH, T4TOTAL, FREET4, T3FREE, THYROIDAB in the last 72 hours. Anemia Panel: No results for input(s): VITAMINB12, FOLATE, FERRITIN, TIBC, IRON, RETICCTPCT in the last 72 hours. Urine analysis:    Component Value Date/Time   COLORURINE YELLOW 02/13/2018 0024   APPEARANCEUR CLEAR  02/13/2018 0024   LABSPEC 1.016 02/13/2018 0024   PHURINE 5.0 02/13/2018 0024   GLUCOSEU >=500 (A) 02/13/2018 0024   HGBUR LARGE (A) 02/13/2018 0024   BILIRUBINUR NEGATIVE 02/13/2018 0024   KETONESUR 80 (A) 02/13/2018 0024   PROTEINUR >=300 (A) 02/13/2018 0024   NITRITE NEGATIVE 02/13/2018 0024   LEUKOCYTESUR NEGATIVE 02/13/2018 0024   Sepsis Labs: @LABRCNTIP (procalcitonin:4,lacticidven:4) )No results found for this or any previous visit (from the past 240 hour(s)).   Radiological Exams on Admission: Dg Chest Port 1 View  Result Date: 02/13/2018 CLINICAL DATA:  Shortness of breath EXAM: PORTABLE CHEST 1  VIEW COMPARISON:  06/20/2017 FINDINGS: The heart size and mediastinal contours are within normal limits. Both lungs are clear. The visualized skeletal structures are unremarkable. IMPRESSION: No active disease. Electronically Signed   By: Deatra Robinson M.D.   On: 02/13/2018 03:51    EKG: Independently reviewed.  Tachycardia left axis deviation.  Assessment/Plan Principal Problem:   Diabetic ketoacidosis without coma associated with type 1 diabetes mellitus (HCC) Active Problems:   Transaminitis   Alcohol-induced acute pancreatitis   Alcohol abuse with physiological dependence (HCC)   DKA (diabetic ketoacidoses) (HCC)    1. Severe metabolic acidosis likely from diabetic ketoacidosis and alcoholic ketoacidosis with starvation -patient has been placed on IV insulin infusion for diabetic ketoacidosis with aggressive hydration closely follow metabolic panel for anion gap closure.  In addition we will check salicylate levels.  Closely follow intake output.  We will also check troponins. 2. Pancreatitis likely from alcoholism.  Check CT abdomen and pelvis.  Check triglycerides level continue with aggressive hydration.  Patient received a total of 4 L bolus will give additional boluses and fluids.  Closely follow intake output hematocrit levels creatinine levels for now patient will be  n.p.o.  3. Alcohol abuse advised about quitting.  On CIWA protocol. 4. Elevated LFT likely from alcoholism.  In addition we will check CT abdomen pelvis to make sure there is no obstruction in the CBD.  Check acute hepatitis panel follow LFTs.  Check Tylenol levels. 5. Marijuana abuse. 6. A lot of RBCs seen in the urine.  Will recheck UA after the acute event.   DVT prophylaxis: SCDs for now until we get CAT scan results. Code Status: Full code. Family Communication: Discussed with patient. Disposition Plan: Home. Consults called: Pulmonary critical care. Admission status: Inpatient.   Eduard Clos MD [facility] Pager [phone].  If 7PM-7AM, please contact night-coverage [email or web address] Password [facility]  02/13/2018, 5:43 AM

## 2018-02-13 NOTE — Care Management Note (Signed)
Case Management Note  Patient Details  Name: [name] Wall MRN: [id number] Date of Birth: [birth year]  Subjective/Objective:                  ETOH Abuse and withdarwal  Action/Plan: Following for progression of care. Following for cm needs none present at this time.  Expected Discharge Date:                  Expected Discharge Plan:  Home/Self Care  In-House Referral:  Clinical Social Work  Discharge planning Services  CM Consult  Post Acute Care Choice:    Choice offered to:     DME Arranged:    DME Agency:     HH Arranged:    HH Agency:     Status of Service:  In process, will continue to follow  If discussed at Long Length of Stay Meetings, dates discussed:    Additional Comments:  Golda Acre, RN 02/13/2018, 9:38 AM

## 2018-02-14 ENCOUNTER — Inpatient Hospital Stay (HOSPITAL_COMMUNITY): Payer: 59

## 2018-02-14 ENCOUNTER — Encounter (HOSPITAL_COMMUNITY): Payer: Self-pay | Admitting: Radiology

## 2018-02-14 LAB — HEPATIC FUNCTION PANEL
ALBUMIN: 3.6 g/dL (ref 3.5–5.0)
ALT: 110 U/L — AB (ref 0–44)
AST: 58 U/L — AB (ref 15–41)
Alkaline Phosphatase: 63 U/L (ref 38–126)
BILIRUBIN INDIRECT: 1.1 mg/dL — AB (ref 0.3–0.9)
Bilirubin, Direct: 0.1 mg/dL (ref 0.0–0.2)
TOTAL PROTEIN: 5.7 g/dL — AB (ref 6.5–8.1)
Total Bilirubin: 1.2 mg/dL (ref 0.3–1.2)

## 2018-02-14 LAB — GLUCOSE, CAPILLARY
GLUCOSE-CAPILLARY: 112 mg/dL — AB (ref 70–99)
GLUCOSE-CAPILLARY: 138 mg/dL — AB (ref 70–99)
GLUCOSE-CAPILLARY: 67 mg/dL — AB (ref 70–99)
Glucose-Capillary: 167 mg/dL — ABNORMAL HIGH (ref 70–99)
Glucose-Capillary: 208 mg/dL — ABNORMAL HIGH (ref 70–99)
Glucose-Capillary: 156 mg/dL — ABNORMAL HIGH (ref 70–99)
Glucose-Capillary: 172 mg/dL — ABNORMAL HIGH (ref 70–99)
Glucose-Capillary: 108 mg/dL — ABNORMAL HIGH (ref 70–99)

## 2018-02-14 LAB — BASIC METABOLIC PANEL
ANION GAP: 9 (ref 5–15)
BUN: 6 mg/dL (ref 6–20)
CHLORIDE: 107 mmol/L (ref 98–111)
CO2: 19 mmol/L — AB (ref 22–32)
CREATININE: 0.53 mg/dL — AB (ref 0.61–1.24)
Calcium: 7.8 mg/dL — ABNORMAL LOW (ref 8.9–10.3)
GFR calc non Af Amer: >60 mL/min (ref >60)
Glucose, Bld: 167 mg/dL — ABNORMAL HIGH (ref 70–99)
Potassium: 3.3 mmol/L — ABNORMAL LOW (ref 3.5–5.1)
Sodium: 135 mmol/L (ref 135–145)

## 2018-02-14 LAB — CBC
HCT: 34.6 % — ABNORMAL LOW (ref 39.0–52.0)
Hemoglobin: 11.9 g/dL — ABNORMAL LOW (ref 13.0–17.0)
MCH: 31.6 pg (ref 26.0–34.0)
MCHC: 34.4 g/dL (ref 30.0–36.0)
MCV: 92 fL (ref 80.0–100.0)
NRBC: 0 % (ref 0.0–0.2)
PLATELETS: 158 10*3/uL (ref 150–400)
RBC: 3.76 MIL/uL — AB (ref 4.22–5.81)
RDW: 11.7 % (ref 11.5–15.5)
WBC: 6.6 10*3/uL (ref 4.0–10.5)

## 2018-02-14 LAB — LIPASE, BLOOD: LIPASE: 282 U/L — AB (ref 11–51)

## 2018-02-14 LAB — HEPATITIS PANEL, ACUTE
HCV Ab: <0.1 s/co ratio (ref 0.0–0.9)
HEP B S AG: NEGATIVE
Hep A IgM: NEGATIVE
Hep B C IgM: NEGATIVE

## 2018-02-14 LAB — PHOSPHORUS: Phosphorus: 2.4 mg/dL — ABNORMAL LOW (ref 2.5–4.6)

## 2018-02-14 MED ORDER — POTASSIUM CHLORIDE 2 MEQ/ML IV SOLN
INTRAVENOUS | Status: DC
  Administered 2018-02-14 – 2018-02-15 (×2): via INTRAVENOUS
  Filled 2018-02-14 (×5): qty 1000

## 2018-02-14 MED ORDER — SODIUM CHLORIDE (PF) 0.9 % IJ SOLN
INTRAMUSCULAR | Status: AC
  Filled 2018-02-14: qty 50

## 2018-02-14 MED ORDER — IOHEXOL 300 MG/ML  SOLN: 15.0000 mL | Freq: Once | INTRAMUSCULAR | Status: DC | PRN

## 2018-02-14 MED ORDER — INSULIN DETEMIR 100 UNIT/ML ~~LOC~~ SOLN
20.0000 [IU] | Freq: Two times a day (BID) | SUBCUTANEOUS | Status: DC
  Administered 2018-02-14 – 2018-02-15 (×3): 20 [IU] via SUBCUTANEOUS
  Filled 2018-02-14 (×4): qty 0.2

## 2018-02-14 MED ORDER — DEXTROSE 50 % IV SOLN
INTRAVENOUS | Status: AC
  Administered 2018-02-14: 12:00:00
  Filled 2018-02-14: qty 50

## 2018-02-14 MED ORDER — POTASSIUM CHLORIDE CRYS ER 20 MEQ PO TBCR
40.0000 meq | EXTENDED_RELEASE_TABLET | Freq: Two times a day (BID) | ORAL | Status: AC
  Administered 2018-02-14 (×2): 40 meq via ORAL
  Filled 2018-02-14 (×2): qty 2

## 2018-02-14 MED ORDER — IOPAMIDOL (ISOVUE-300) INJECTION 61%
100.0000 mL | Freq: Once | INTRAVENOUS | Status: AC | PRN
  Administered 2018-02-14: 100 mL via INTRAVENOUS

## 2018-02-14 MED ORDER — MORPHINE SULFATE (PF) 2 MG/ML IV SOLN
4.0000 mg | INTRAVENOUS | Status: DC | PRN
  Administered 2018-02-14 – 2018-02-15 (×6): 4 mg via INTRAVENOUS
  Filled 2018-02-14 (×6): qty 2

## 2018-02-14 MED ORDER — SODIUM CHLORIDE 0.9 % IV SOLN
INTRAVENOUS | Status: DC | PRN
  Administered 2018-02-14: 250 mL via INTRAVENOUS

## 2018-02-14 NOTE — Progress Notes (Signed)
NAME:  [name] Wall, MRN:  [id number], DOB:  October 20, 1990, LOS: 1 ADMISSION DATE:  02/12/2018, CONSULTATION DATE:  11/7REFERRING MD:  Robb Matar, CHIEF COMPLAINT:  DKA w/ severe metabolic derangements    Brief History   27 year old male patient admitted with working diagnosis of DKA, and acute alcoholic pancreatitis on 11/7.  Critical care asked to assist with management given profound metabolic acidosis  Past Medical History  Diabetes, bipolar disorder, pancreatitis, alcohol abuse Significant Hospital Events   11/6: Presented to the emergency room with chief complaint of diffuse abdominal pain, nausea vomiting for 2 days, inability to tolerate p.o. intake and inability to take insulin.  Bicarbonate less than 7, glucose 351, Lipase 1125, UDS positive for benzodiazepines and THC, lactic acid initially 2.02 clear to 0.9 following fluid resuscitation.  Started on IV fluids and IV insulin 11/7: still w/ marked ag metabolic acidosis. PCCM asked to a/w care; MIVFs changed to LR d/t mixed picture of AG and NAGMA. IV insulin cont'd  11/8: AG closed. Transitioned to SSI. CT abd ordered to further eval CT abd  Consults: date of consult/date signed off & final recs:  Critical care consulted 11/7  Procedures (surgical and bedside):    Significant Diagnostic Tests:    Micro Data:  MRSA screen 11/7: Negative  Antimicrobials:    Subjective:  Still having pain  Objective   Blood pressure 128/90, pulse 80, temperature 98.4 F (36.9 C), temperature source Oral, resp. rate 13, height 5\' 10"  (1.778 m), weight 74.8 kg, SpO2 98 %.        Intake/Output Summary (Last 24 hours) at 02/14/2018 0816 Last data filed at 02/14/2018 0415 Gross per 24 hour  Intake 3620.49 ml  Output 4500 ml  Net -879.51 ml   Filed Weights   02/12/18 1852  Weight: 74.8 kg    Examination: General: 27 year old white male currently resting in bed he is in no acute distress no guarding HEENT: Normocephalic atraumatic no  jugular venous distention Pulmonary: Clear to auscultation diminished bases no accessory use Cardiac: Regular rate and rhythm without murmur rub or gallop Abdomen: Soft, under to palpation hypoactive bowel sounds GU: Clear yellow Neuro: Awake oriented no focal deficits Extremities: Warm dry brisk capillary refill strong pulses  Resolved Hospital Problem list   DKA Anion Gap Metabolic Acidosis   Assessment & Plan:   Acute pancreatitis  -presume ETOH related; Lipase trending down Plan CT abd per IM service PRN analgesia   NAGMA ->following NaCl resuscitation.  -> AG closed  Plan Cont LR @ 50cc/hr Am chemistry  Fluid and electrolyte imbalance: Hypokalemia Plan Replace and recheck PRN  Poorly controlled DM (A1c 8.4) Plan   Elevated LFTs->cont to trend down  Plan Trend LFTs  H/o ETOH abuse  Plan CIWA protocol   Disposition / Summary of Today's Plan 02/14/18   No longer critically ill Anion gap closed.  PCCM signing off    Diet: 11/7: N.p.o. Pain/Anxiety/Delirium protocol (if indicated): Not indicated VAP protocol (if indicated): Not indicated DVT prophylaxis: SCD 11/7 until CT abdomen pelvis completed GI prophylaxis: Not indicated Hyperglycemia protocol: DKA protocol 11/7-->SSI 11/8 Mobility: Advance as tolerated Code Status: Full code Family Communication: Pending  Labs   CBC: Recent Labs  Lab 02/12/18 1853 02/14/18 0314  WBC 8.7 6.6  HGB 15.7 11.9*  HCT 47.7 34.6*  MCV 95.6 92.0  PLT 268 158    Basic Metabolic Panel: Recent Labs  Lab 02/13/18 0541 02/13/18 0832 02/13/18 1325 02/13/18 1736 02/13/18 2151 02/14/18 0314 02/14/18  0703  NA 139 136 136 136 138  --  135  K 4.3 3.9 3.2* 2.8* 3.2*  --  3.3*  CL 112* 110 108 109 109  --  107  CO2 <7* 9* 11* 17* 20*  --  19*  GLUCOSE 149* 170* 248* 117* 107*  --  167*  BUN 10 8 7 7 7   --  6  CREATININE 0.79 0.65 0.67 0.54* 0.56*  --  0.53*  CALCIUM 7.3* 7.2* 7.5* 7.8* 7.6*  --  7.8*  MG 1.8   --   --   --   --   --   --   PHOS 1.4*  --   --   --   --  2.4*  --    GFR: Estimated Creatinine Clearance: 145.7 mL/min (A) (by C-G formula based on SCr of 0.53 mg/dL (L)). Recent Labs  Lab 02/12/18 1853 02/13/18 0031 02/13/18 0153 02/13/18 0544 02/14/18 0314  WBC 8.7  --   --   --  6.6  LATICACIDVEN  --  3.83* 2.02* 0.9  --     Liver Function Tests: Recent Labs  Lab 02/12/18 1853 02/13/18 0541 02/14/18 0314  AST 232* 164* 58*  ALT 232* 188* 110*  ALKPHOS 112 92 63  BILITOT 2.2* 1.9* 1.2  PROT 8.4* 7.1 5.7*  ALBUMIN 5.2* 4.4 3.6   Recent Labs  Lab 02/13/18 0019 02/14/18 0314  LIPASE 1,125* 282*   No results for input(s): AMMONIA in the last 168 hours.  ABG    Component Value Date/Time   PHART 6.982 (LL) 02/13/2018 0210   PCO2ART VALUE BELOW REPORTABLE RANGE 02/13/2018 0210   PO2ART 130 (H) 02/13/2018 0210   HCO3 22.2 06/29/2016 2217   TCO2 14 06/29/2016 2201   ACIDBASEDEF 4.4 (H) 06/29/2016 2217   O2SAT 97.3 02/13/2018 0210     Coagulation Profile: No results for input(s): INR, PROTIME in the last 168 hours.  Cardiac Enzymes: Recent Labs  Lab 02/13/18 0541 02/13/18 0611  CKTOTAL  --  74  TROPONINI <0.03  --     HbA1C: Hemoglobin A1C  Date/Time Value Ref Range Status  09/10/2017 10:22 AM 9.9 (A) 4.0 - 5.6 % Final  03/14/2016 01:42 PM 10.1  Final   Hgb A1c MFr Bld  Date/Time Value Ref Range Status  02/13/2018 05:43 AM 8.4 (H) 4.8 - 5.6 % Final    Comment:    (NOTE) Pre diabetes:          5.7%-6.4% Diabetes:              >6.4% Glycemic control for   <7.0% adults with diabetes     CBG: Recent Labs  Lab 02/13/18 2308 02/14/18 0014 02/14/18 0114 02/14/18 0313 02/14/18 0803  GLUCAP 105* 138* 167* 208* 156*    Critical care time: NA     Simonne Martinet ACNP-BC [facility] Pulmonary/Critical Care Pager # [phone] OR # [phone] if no answer

## 2018-02-14 NOTE — Progress Notes (Addendum)
[facility] PROGRESS NOTE    Progress Note  [name] Wall  ZOX:[id number] DOB: 01/23/1991 DOA: 02/12/2018 PCP: Heide Scales, PA-C     Brief Narrative:   [name] Wall is an 27 y.o. male past medical history of diabetes mellitus type 2, alcohol abuse presents to the ER with severe chest pain nausea and vomiting 2 days ago with pain radiating to his back he drinks alcohol every day.  Assessment/Plan:   Diabetic ketoacidosis without coma associated with type 1 diabetes mellitus (HCC) Multifactorial likely due to noncompliance with his insulin, alcoholic ketosis starvation ketosis. Has been transitioned to long-acting insulin plus sliding scale.  Still n.p.o.  Acute alcoholic pancreatitis: Likely due to alcohol abuse. UDS positive for benzodiazepines and marijuana. Keep him n.p.o. continue IV fluids and narcotics for pain. Cont morphine IV PRN. Complaining of abdominal pain and nausea get a CT scan of the abdomen and pelvis.  Alcohol abuse with physiological dependence (HCC) Last drink was today prior to coming in. Continue thiamine and folate. Continue to monitor.  Elevated LFTs: LFTs are trending down likely due to alcohol   DVT prophylaxis: lovenxo Family Communication:none Disposition Plan/Barrier to D/C: Transfer to MedSurg Code Status:     Code Status Orders  (From admission, onward)         Start     Ordered   02/13/18 0541  Full code  Continuous     02/13/18 0542        Code Status History    This patient has a current code status but no historical code status.        IV Access:    Peripheral IV   Procedures and diagnostic studies:   Dg Chest Port 1 View  Result Date: 02/13/2018 CLINICAL DATA:  Shortness of breath EXAM: PORTABLE CHEST 1 VIEW COMPARISON:  06/20/2017 FINDINGS: The heart size and mediastinal contours are within normal limits. Both lungs are clear. The visualized skeletal structures are unremarkable.  IMPRESSION: No active disease. Electronically Signed   By: Deatra Robinson M.D.   On: 02/13/2018 03:51     Medical Consultants:    None.  Anti-Infectives:    Subjective:    Theora Gianotti week still having pain, is asking when to eat.  Objective:    Vitals:   02/14/18 0344 02/14/18 0400 02/14/18 0500 02/14/18 0600  BP:  125/87 125/89 134/89  Pulse:  84 87 82  Resp:  13 14 13   Temp: 98.4 F (36.9 C)     TempSrc: Oral     SpO2:  99% 99% 98%  Weight:      Height:        Intake/Output Summary (Last 24 hours) at 02/14/2018 0701 Last data filed at 02/14/2018 0415 Gross per 24 hour  Intake 3620.49 ml  Output 4500 ml  Net -879.51 ml   Filed Weights   02/12/18 1852  Weight: 74.8 kg    Exam: General exam: In no acute distress. Respiratory system: Good air movement and clear to auscultation. Cardiovascular system: S1 & S2 heard, RRR.  Gastrointestinal system: Abdomen is nondistended, soft and nontender.  Central nervous system: Alert and oriented. No focal neurological deficits. Extremities: No pedal edema. Skin: No rashes, lesions or ulcers Psychiatry: Judgement and insight appear normal. Mood & affect appropriate.   Data Reviewed:    Labs: Basic Metabolic Panel: Recent Labs  Lab 02/13/18 0541 02/13/18 0832 02/13/18 1325 02/13/18 1736 02/13/18 2151 02/14/18 0314  NA 139 136 136 136 138  --  K 4.3 3.9 3.2* 2.8* 3.2*  --   CL 112* 110 108 109 109  --   CO2 <7* 9* 11* 17* 20*  --   GLUCOSE 149* 170* 248* 117* 107*  --   BUN 10 8 7 7 7   --   CREATININE 0.79 0.65 0.67 0.54* 0.56*  --   CALCIUM 7.3* 7.2* 7.5* 7.8* 7.6*  --   MG 1.8  --   --   --   --   --   PHOS 1.4*  --   --   --   --  2.4*   GFR Estimated Creatinine Clearance: 145.7 mL/min (A) (by C-G formula based on SCr of 0.56 mg/dL (L)). Liver Function Tests: Recent Labs  Lab 02/12/18 1853 02/13/18 0541 02/14/18 0314  AST 232* 164* 58*  ALT 232* 188* 110*  ALKPHOS 112 92 63  BILITOT  2.2* 1.9* 1.2  PROT 8.4* 7.1 5.7*  ALBUMIN 5.2* 4.4 3.6   Recent Labs  Lab 02/13/18 0019 02/14/18 0314  LIPASE 1,125* 282*   No results for input(s): AMMONIA in the last 168 hours. Coagulation profile No results for input(s): INR, PROTIME in the last 168 hours.  CBC: Recent Labs  Lab 02/12/18 1853 02/14/18 0314  WBC 8.7 6.6  HGB 15.7 11.9*  HCT 47.7 34.6*  MCV 95.6 92.0  PLT 268 158   Cardiac Enzymes: Recent Labs  Lab 02/13/18 0541 02/13/18 0611  CKTOTAL  --  74  TROPONINI <0.03  --    BNP (last 3 results) No results for input(s): PROBNP in the last 8760 hours. CBG: Recent Labs  Lab 02/13/18 2155 02/13/18 2308 02/14/18 0014 02/14/18 0114 02/14/18 0313  GLUCAP 95 105* 138* 167* 208*   D-Dimer: No results for input(s): DDIMER in the last 72 hours. Hgb A1c: Recent Labs    02/13/18 0543  HGBA1C 8.4*   Lipid Profile: Recent Labs    02/13/18 0611  TRIG 442*   Thyroid function studies: No results for input(s): TSH, T4TOTAL, T3FREE, THYROIDAB in the last 72 hours.  Invalid input(s): FREET3 Anemia work up: No results for input(s): VITAMINB12, FOLATE, FERRITIN, TIBC, IRON, RETICCTPCT in the last 72 hours. Sepsis Labs: Recent Labs  Lab 02/12/18 1853 02/13/18 0031 02/13/18 0153 02/13/18 0544 02/14/18 0314  WBC 8.7  --   --   --  6.6  LATICACIDVEN  --  3.83* 2.02* 0.9  --    Microbiology Recent Results (from the past 240 hour(s))  MRSA PCR Screening     Status: None   Collection Time: 02/13/18  4:30 AM  Result Value Ref Range Status   MRSA by PCR NEGATIVE NEGATIVE Final    Comment:        The GeneXpert MRSA Assay (FDA approved for NASAL specimens only), is one component of a comprehensive MRSA colonization surveillance program. It is not intended to diagnose MRSA infection nor to guide or monitor treatment for MRSA infections. Performed at [facility], [address] [place], [place] [address]      Medications:     . folic acid  1 mg Intravenous Daily  . insulin aspart  0-9 Units Subcutaneous Q4H  . insulin detemir  20 Units Subcutaneous BID  . mouth rinse  15 mL Mouth Rinse BID  . nicotine  14 mg Transdermal Daily  . potassium chloride  40 mEq Oral BID  . thiamine injection  100 mg Intravenous Daily   Continuous Infusions: . sodium chloride 10 mL/hr  at 02/14/18 0415  . famotidine (PEPCID) IV Stopped (02/13/18 2247)  . lactated ringers 50 mL/hr at 02/14/18 0415      LOS: 1 day   Marinda Elk  [facility]   *Please refer to [email or web address], password [email or web address] to get updated schedule on who will round on this patient, as hospitalists switch teams weekly. If 7PM-7AM, please contact night-coverage at [email or web address], password [email or web address] for any overnight needs.  02/14/2018, 7:01 AM

## 2018-02-14 NOTE — Progress Notes (Addendum)
CO2 20, anion gap 9 & K+ 3.2 and last 4 CBGs stable. Paged Bodenheimer to transition off glucose stabilizer and 3 runs of potassium. Orders placed for the transition. Continue to monitor.

## 2018-02-15 LAB — BASIC METABOLIC PANEL
Anion gap: 11 (ref 5–15)
CHLORIDE: 104 mmol/L (ref 98–111)
CO2: 23 mmol/L (ref 22–32)
CREATININE: 0.39 mg/dL — AB (ref 0.61–1.24)
Calcium: 8.4 mg/dL — ABNORMAL LOW (ref 8.9–10.3)
GFR calc Af Amer: >60 mL/min (ref >60)
GFR calc non Af Amer: >60 mL/min (ref >60)
Glucose, Bld: 130 mg/dL — ABNORMAL HIGH (ref 70–99)
POTASSIUM: 2.9 mmol/L — AB (ref 3.5–5.1)
SODIUM: 138 mmol/L (ref 135–145)

## 2018-02-15 LAB — GLUCOSE, CAPILLARY
GLUCOSE-CAPILLARY: 109 mg/dL — AB (ref 70–99)
Glucose-Capillary: 131 mg/dL — ABNORMAL HIGH (ref 70–99)
Glucose-Capillary: 135 mg/dL — ABNORMAL HIGH (ref 70–99)
Glucose-Capillary: 116 mg/dL — ABNORMAL HIGH (ref 70–99)

## 2018-02-15 MED ORDER — PANTOPRAZOLE SODIUM 40 MG PO TBEC: 40.0000 mg | DELAYED_RELEASE_TABLET | Freq: Every day | ORAL | 1 refills | Status: AC

## 2018-02-15 MED ORDER — POTASSIUM CHLORIDE CRYS ER 20 MEQ PO TBCR
40.0000 meq | EXTENDED_RELEASE_TABLET | Freq: Three times a day (TID) | ORAL | Status: DC
  Administered 2018-02-15: 40 meq via ORAL
  Filled 2018-02-15: qty 2

## 2018-02-15 NOTE — Discharge Summary (Addendum)
Physician Discharge Summary  [name] Wall WUJ:[id number] DOB: 1990-06-16 DOA: 02/12/2018  PCP: Heide Scales, PA-C  Admit date: 02/12/2018 Discharge date: 02/15/2018  Admitted From: home Disposition:  Home  Recommendations for Outpatient Follow-up:  1. Follow up with PCP in 1-2 weeks 2.   Home Health:No Equipment/Devices:NONE  Discharge Condition:STABLE CODE STATUS:FULL Diet recommendation: Heart Healthy  Brief/Interim Summary: 27 y.o. male past medical history of diabetes mellitus type 2, alcohol abuse presents to the ER with severe chest pain nausea and vomiting 2 days ago with pain radiating to his back he drinks alcohol every day.  Discharge Diagnoses:  Principal Problem:   Diabetic ketoacidosis without coma associated with type 1 diabetes mellitus (HCC) Active Problems:   Transaminitis   Alcohol-induced acute pancreatitis   Alcohol abuse with physiological dependence (HCC)   DKA (diabetic ketoacidoses) (HCC) DKA without coma associated with diabetes mellitus type 2: Multifactorial in the setting of noncompliance with his insulin, alcoholic ketosis. He was started on IV insulin his anion gap closed his bicarb improved greater than 20. He was started on low-dose long-acting insulin and sliding scale which she will continue at home no changes were made.  Acute alcoholic pancreatitis: His UDS was positive for benzodiazepines and marijuana. He was placed n.p.o. on IV fluids and narcotics once his pain prove he was started on a diet which he tolerated.  Alcohol abuse with physiological dependence: He was started on the CIWA protocol which he completed.  Elevated LFTs: Likely due to alcohol and fatty liver. CT scan of the abdomen was done that showed fatty liver.   Discharge Instructions  Discharge Instructions    Diet - low sodium heart healthy   Complete by:  As directed    Increase activity slowly   Complete by:  As directed      Allergies as of  02/15/2018   No Known Allergies     Medication List    TAKE these medications   amphetamine-dextroamphetamine 30 MG tablet Commonly known as:  ADDERALL Take 30 mg by mouth 2 (two) times daily.   glucose blood test strip Used to check blood sugars 4x daily.   insulin degludec 100 UNIT/ML Sopn FlexTouch Pen Commonly known as:  TRESIBA Inject 0.5 mLs (50 Units total) into the skin daily.   nicotine 21 mg/24hr patch Commonly known as:  NICODERM CQ - dosed in mg/24 hours Place 1 patch (21 mg total) onto the skin daily.   NOVOLOG FLEXPEN 100 UNIT/ML FlexPen Generic drug:  insulin aspart INJECT 6 UNITS INTO THE SKIN 3 (THREE) TIMES DAILY WITH MEALS. AND PEN NEEDLES 4/DAY What changed:  See the new instructions.   pantoprazole 40 MG tablet Commonly known as:  PROTONIX Take 1 tablet (40 mg total) by mouth daily.       No Known Allergies  Consultations:  nONE   Procedures/Studies: Ct Abdomen Pelvis W Contrast  Result Date: 02/14/2018 CLINICAL DATA:  Acute abdominal pain.  Pancreatitis. EXAM: CT ABDOMEN AND PELVIS WITH CONTRAST TECHNIQUE: Multidetector CT imaging of the abdomen and pelvis was performed using the standard protocol following bolus administration of intravenous contrast. CONTRAST:  ISOVUE-300 IOPAMIDOL (ISOVUE-300) INJECTION 61% COMPARISON:  None. FINDINGS: Lower chest: Trace left pleural effusion with mild atelectasis in the posterior basal segment left lower lobe. Hepatobiliary: Diffuse hepatic steatosis with mild fatty sparing along the gallbladder fossa. There is also some focally more striking hepatic steatosis along the falciform ligament. Borderline gallbladder wall thickening. No appreciable biliary dilatation. Pancreas: Mild peripancreatic  edema. No focal pancreatic lesion is identified. No evidence of pancreatic abscess, pseudocyst, or pancreatic necrosis. Spleen: Unremarkable Adrenals/Urinary Tract: Unremarkable Stomach/Bowel: There is edema in the  pancreaticoduodenal groove potentially with some minimal secondary inflammatory findings in the descending duodenum. The appendix is not well seen. Orally administered contrast extends through to the rectum. Vascular/Lymphatic: Retroaortic left renal vein. Small likely reactive peripancreatic and retroperitoneal lymph nodes. Reproductive: Unremarkable Other: Trace ascites in the paracolic gutters. Small but abnormal amount of pelvic ascites. Low-level edema tracks in the central mesentery. Musculoskeletal: Subacute healing fractures of the right anterior seventh and eighth ribs. IMPRESSION: 1. Acute pancreatitis with peripancreatic edema, mild mesenteric edema, and a small amount of ascites. No pancreatic abscess, pseudocyst, or pancreatic necrosis identified. 2. Hepatic steatosis. 3. Subacute healing fractures of the right anterior seventh and eighth ribs. Electronically Signed   By: Gaylyn Rong M.D.   On: 02/14/2018 12:59   Dg Chest Port 1 View  Result Date: 02/13/2018 CLINICAL DATA:  Shortness of breath EXAM: PORTABLE CHEST 1 VIEW COMPARISON:  06/20/2017 FINDINGS: The heart size and mediastinal contours are within normal limits. Both lungs are clear. The visualized skeletal structures are unremarkable. IMPRESSION: No active disease. Electronically Signed   By: Deatra Robinson M.D.   On: 02/13/2018 03:51      Subjective: He is tolerating his diet.  No abdominal pain.  Discharge Exam: Vitals:   02/14/18 2000 02/15/18 0430  BP: 132/82 128/90  Pulse: 92 84  Resp: 14 16  Temp: 98.6 F (37 C) 98.2 F (36.8 C)  SpO2: 98% 99%   Vitals:   02/14/18 1520 02/14/18 1550 02/14/18 2000 02/15/18 0430  BP:  (!) 133/105 132/82 128/90  Pulse: 74 84 92 84  Resp: 14 16 14 16   Temp:  98.7 F (37.1 C) 98.6 F (37 C) 98.2 F (36.8 C)  TempSrc:  Oral Oral Oral  SpO2: 100% 100% 98% 99%  Weight:  74 kg    Height:  5\' 10"  (1.778 m)      General: Pt is alert, awake, not in acute  distress Cardiovascular: RRR, S1/S2 +, no rubs, no gallops Respiratory: CTA bilaterally, no wheezing, no rhonchi Abdominal: Soft, NT, ND, bowel sounds + Extremities: no edema, no cyanosis    The results of significant diagnostics from this hospitalization (including imaging, microbiology, ancillary and laboratory) are listed below for reference.     Microbiology: Recent Results (from the past 240 hour(s))  MRSA PCR Screening     Status: None   Collection Time: 02/13/18  4:30 AM  Result Value Ref Range Status   MRSA by PCR NEGATIVE NEGATIVE Final    Comment:        The GeneXpert MRSA Assay (FDA approved for NASAL specimens only), is one component of a comprehensive MRSA colonization surveillance program. It is not intended to diagnose MRSA infection nor to guide or monitor treatment for MRSA infections. Performed at [facility], [address] [place], [place] [address]      Labs: BNP (last 3 results) No results for input(s): BNP in the last 8760 hours. Basic Metabolic Panel: Recent Labs  Lab 02/13/18 0541  02/13/18 1325 02/13/18 1736 02/13/18 2151 02/14/18 0314 02/14/18 0703 02/15/18 0437  NA 139   < > 136 136 138  --  135 138  K 4.3   < > 3.2* 2.8* 3.2*  --  3.3* 2.9*  CL 112*   < > 108 109 109  --  107 104  CO2 <7*   < > 11* 17* 20*  --  19* 23  GLUCOSE 149*   < > 248* 117* 107*  --  167* 130*  BUN 10   < > 7 7 7   --  6 <5*  CREATININE 0.79   < > 0.67 0.54* 0.56*  --  0.53* 0.39*  CALCIUM 7.3*   < > 7.5* 7.8* 7.6*  --  7.8* 8.4*  MG 1.8  --   --   --   --   --   --   --   PHOS 1.4*  --   --   --   --  2.4*  --   --    < > = values in this interval not displayed.   Liver Function Tests: Recent Labs  Lab 02/12/18 1853 02/13/18 0541 02/14/18 0314  AST 232* 164* 58*  ALT 232* 188* 110*  ALKPHOS 112 92 63  BILITOT 2.2* 1.9* 1.2  PROT 8.4* 7.1 5.7*  ALBUMIN 5.2* 4.4 3.6   Recent Labs  Lab 02/13/18 0019 02/14/18 0314  LIPASE  1,125* 282*   No results for input(s): AMMONIA in the last 168 hours. CBC: Recent Labs  Lab 02/12/18 1853 02/14/18 0314  WBC 8.7 6.6  HGB 15.7 11.9*  HCT 47.7 34.6*  MCV 95.6 92.0  PLT 268 158   Cardiac Enzymes: Recent Labs  Lab 02/13/18 0541 02/13/18 0611  CKTOTAL  --  74  TROPONINI <0.03  --    BNP: Invalid input(s): POCBNP CBG: Recent Labs  Lab 02/14/18 1703 02/14/18 2000 02/14/18 2342 02/15/18 0416 02/15/18 0746  GLUCAP 112* 131* 109* 135* 116*   D-Dimer No results for input(s): DDIMER in the last 72 hours. Hgb A1c Recent Labs    02/13/18 0543  HGBA1C 8.4*   Lipid Profile Recent Labs    02/13/18 0611  TRIG 442*   Thyroid function studies No results for input(s): TSH, T4TOTAL, T3FREE, THYROIDAB in the last 72 hours.  Invalid input(s): FREET3 Anemia work up No results for input(s): VITAMINB12, FOLATE, FERRITIN, TIBC, IRON, RETICCTPCT in the last 72 hours. Urinalysis    Component Value Date/Time   COLORURINE YELLOW 02/13/2018 0024   APPEARANCEUR CLEAR 02/13/2018 0024   LABSPEC 1.016 02/13/2018 0024   PHURINE 5.0 02/13/2018 0024   GLUCOSEU >=500 (A) 02/13/2018 0024   HGBUR LARGE (A) 02/13/2018 0024   BILIRUBINUR NEGATIVE 02/13/2018 0024   KETONESUR 80 (A) 02/13/2018 0024   PROTEINUR >=300 (A) 02/13/2018 0024   NITRITE NEGATIVE 02/13/2018 0024   LEUKOCYTESUR NEGATIVE 02/13/2018 0024   Sepsis Labs Invalid input(s): PROCALCITONIN,  WBC,  LACTICIDVEN Microbiology Recent Results (from the past 240 hour(s))  MRSA PCR Screening     Status: None   Collection Time: 02/13/18  4:30 AM  Result Value Ref Range Status   MRSA by PCR NEGATIVE NEGATIVE Final    Comment:        The GeneXpert MRSA Assay (FDA approved for NASAL specimens only), is one component of a comprehensive MRSA colonization surveillance program. It is not intended to diagnose MRSA infection nor to guide or monitor treatment for MRSA infections. Performed at [facility], [address], [place], [place] [address]      Time coordinating discharge: 40 minutes  SIGNED:   Marinda Elk, MD  [facility] 02/15/2018, 10:48 AM Pager   If 7PM-7AM, please contact night-coverage [email or web address] Password [email or web address]

## 2018-02-21 ENCOUNTER — Ambulatory Visit (INDEPENDENT_AMBULATORY_CARE_PROVIDER_SITE_OTHER): Payer: 59 | Admitting: Endocrinology

## 2018-02-21 ENCOUNTER — Encounter: Payer: Self-pay | Admitting: Endocrinology

## 2018-02-21 VITALS — BP 122/74 | HR 93 | Ht 70.0 in | Wt 159.4 lb

## 2018-02-21 DIAGNOSIS — E119 Type 2 diabetes mellitus without complications: Secondary | ICD-10-CM

## 2018-02-21 LAB — POCT GLYCOSYLATED HEMOGLOBIN (HGB A1C): Hemoglobin A1C: 8.4 % — AB (ref 4.0–5.6)

## 2018-02-21 MED ORDER — GLUCAGON (RDNA) 1 MG IJ KIT: 1.0000 mg | PACK | Freq: Once | INTRAMUSCULAR | 12 refills | Status: AC | PRN

## 2018-02-21 MED ORDER — INSULIN ASPART 100 UNIT/ML FLEXPEN: 5.0000 [IU] | PEN_INJECTOR | Freq: Three times a day (TID) | SUBCUTANEOUS | 4 refills | Status: AC

## 2018-02-21 NOTE — Progress Notes (Signed)
Subjective:    Patient ID: [name] Wall, male    DOB: 12/07/1990, 27 y.o.   MRN: [id number]  HPI Pt returns for f/u of diabetes mellitus:  DM type: 1 Dx'ed: 2015 Complications: none Therapy: insulin since  DKA: only at time of rx Severe hypoglycemia: last episode was early 2017.   Pancreatitis: never Other: rx has been limited by noncompliance, alcoholism, marijuana use, and bipolar disorder (pt says he cannot afford psychiatry copay); he works at [employer], variable shifts; he takes multiple daily injections.  Interval history: He was recently in the hospital with DKA.  He feels better now.  He is checking into inpt rehab tomorrow, for 30 days.  no cbg record, but states cbg's since at home, cbg's are approx 200.   He reduced tresiba to 30/d, due to hypoglycemia, and novolog at a varying dosage, approx 10-15 units 3 times a day (just before each meal).   Past Medical History:  Diagnosis Date  . ADD (attention deficit disorder)   . Bipolar disorder (HCC)   . Diabetes mellitus without complication (HCC)   . Pancreatitis     Past Surgical History:  Procedure Laterality Date  . Arm surgery    . FINGER SURGERY      Social History   Socioeconomic History  . Marital status: Single    Spouse name: Not on file  . Number of children: Not on file  . Years of education: Not on file  . Highest education level: Not on file  Occupational History  . Not on file  Social Needs  . Financial resource strain: Not on file  . Food insecurity:    Worry: Not on file    Inability: Not on file  . Transportation needs:    Medical: Not on file    Non-medical: Not on file  Tobacco Use  . Smoking status: Current Every Day Smoker    Packs/day: 0.50    Types: Cigarettes  . Smokeless tobacco: Former Engineer, water and Sexual Activity  . Alcohol use: Yes    Comment: pt states half of a fifth/day  . Drug use: No  . Sexual activity: Not on file  Lifestyle  . Physical activity:   Days per week: Not on file    Minutes per session: Not on file  . Stress: Not on file  Relationships  . Social connections:    Talks on phone: Not on file    Gets together: Not on file    Attends religious service: Not on file    Active member of club or organization: Not on file    Attends meetings of clubs or organizations: Not on file    Relationship status: Not on file  . Intimate partner violence:    Fear of current or ex partner: Not on file    Emotionally abused: Not on file    Physically abused: Not on file    Forced sexual activity: Not on file  Other Topics Concern  . Not on file  Social History Narrative  . Not on file    Current Outpatient Medications on File Prior to Visit  Medication Sig Dispense Refill  . amphetamine-dextroamphetamine (ADDERALL) 30 MG tablet Take 30 mg by mouth 2 (two) times daily.      Marland Kitchen glucose blood (ACCU-CHEK GUIDE) test strip Used to check blood sugars 4x daily. 200 each 12  . insulin degludec (TRESIBA FLEXTOUCH) 100 UNIT/ML SOPN FlexTouch Pen Inject 0.5 mLs (50 Units total) into the  skin daily. 5 pen 11  . nicotine (NICODERM CQ - DOSED IN MG/24 HOURS) 21 mg/24hr patch Place 1 patch (21 mg total) onto the skin daily. 28 patch 11  . pantoprazole (PROTONIX) 40 MG tablet Take 1 tablet (40 mg total) by mouth daily. 30 tablet 1   No current facility-administered medications on file prior to visit.     No Known Allergies  Family History  Problem Relation Age of Onset  . Diabetes Father   . Cancer Other   . CAD Other     BP 122/74 (BP Location: Left Arm, Patient Position: Sitting, Cuff Size: Normal)   Pulse 93   Ht 5\' 10"  (1.778 m)   Wt 159 lb 6.4 oz (72.3 kg)   SpO2 97%   BMI 22.87 kg/m    Review of Systems Denies LOC.      Objective:   Physical Exam VITAL SIGNS:  See vs page GENERAL: no distress Pulses: dorsalis pedis intact bilat.   MSK: no deformity of the feet.   CV: no leg edema.   Skin:  no ulcer on the feet.  normal  color and temp on the feet. Neuro: sensation is intact to touch on the feet.   Lab Results  Component Value Date   HGBA1C 8.4 (A) 02/21/2018        Assessment & Plan:  Type 1 DM: he needs increased rx DKA: this is evidence of noncompliance with insulin.  Plan is to emphasize basal Alcoholism: I agree with plan for inpt rehab  Patient Instructions  Please change your insulin to the numbers listed below.   On this type of insulin schedule, you should eat meals on a regular schedule.  If a meal is missed or significantly delayed, your blood sugar could go low.   check your blood sugar 4 times a day: before the 3 meals, and at bedtime.  also check if you have symptoms of your blood sugar being too high or too low.  please keep a record of the readings and bring it to your next appointment here (or you can bring the meter itself).  You can write it on any piece of paper.  please call us sooner if your blood sugar goes below 70, or if you have a lot of readings over 200.   Please come back for a follow-up appointment in 2 months.

## 2018-02-21 NOTE — Patient Instructions (Addendum)
Please change your insulin to the numbers listed below.   On this type of insulin schedule, you should eat meals on a regular schedule.  If a meal is missed or significantly delayed, your blood sugar could go low.   check your blood sugar 4 times a day: before the 3 meals, and at bedtime.  also check if you have symptoms of your blood sugar being too high or too low.  please keep a record of the readings and bring it to your next appointment here (or you can bring the meter itself).  You can write it on any piece of paper.  please call us sooner if your blood sugar goes below 70, or if you have a lot of readings over 200.   Please come back for a follow-up appointment in 2 months.

## 2018-09-24 ENCOUNTER — Telehealth: Payer: Self-pay

## 2018-09-24 NOTE — Telephone Encounter (Signed)
LOV 02/21/18. Per Dr. Loanne Drilling, f/u in 2 mo. Called pt to schedule appt. Unable to LVM d/t VM not set up.

## 2019-02-16 ENCOUNTER — Observation Stay (HOSPITAL_COMMUNITY): Payer: BLUE CROSS/BLUE SHIELD

## 2019-02-16 ENCOUNTER — Other Ambulatory Visit: Payer: Self-pay

## 2019-02-16 ENCOUNTER — Encounter (HOSPITAL_COMMUNITY): Payer: Self-pay | Admitting: Emergency Medicine

## 2019-02-16 ENCOUNTER — Observation Stay (HOSPITAL_COMMUNITY)
Admission: EM | Admit: 2019-02-16 | Discharge: 2019-02-17 | Disposition: A | Discharge status: Home / Self Care | Payer: BLUE CROSS/BLUE SHIELD | Attending: Internal Medicine | Admitting: Internal Medicine

## 2019-02-16 DIAGNOSIS — E1110 Type 2 diabetes mellitus with ketoacidosis without coma: Secondary | ICD-10-CM | POA: Diagnosis present

## 2019-02-16 DIAGNOSIS — F988 Other specified behavioral and emotional disorders with onset usually occurring in childhood and adolescence: Secondary | ICD-10-CM | POA: Diagnosis not present

## 2019-02-16 DIAGNOSIS — Z79899 Other long term (current) drug therapy: Secondary | ICD-10-CM | POA: Insufficient documentation

## 2019-02-16 DIAGNOSIS — F17210 Nicotine dependence, cigarettes, uncomplicated: Secondary | ICD-10-CM | POA: Insufficient documentation

## 2019-02-16 DIAGNOSIS — Z20828 Contact with and (suspected) exposure to other viral communicable diseases: Secondary | ICD-10-CM | POA: Insufficient documentation

## 2019-02-16 DIAGNOSIS — R1013 Epigastric pain: Secondary | ICD-10-CM | POA: Diagnosis present

## 2019-02-16 DIAGNOSIS — E1169 Type 2 diabetes mellitus with other specified complication: Secondary | ICD-10-CM

## 2019-02-16 DIAGNOSIS — E871 Hypo-osmolality and hyponatremia: Secondary | ICD-10-CM | POA: Diagnosis not present

## 2019-02-16 DIAGNOSIS — E1010 Type 1 diabetes mellitus with ketoacidosis without coma: Secondary | ICD-10-CM | POA: Diagnosis not present

## 2019-02-16 DIAGNOSIS — R0600 Dyspnea, unspecified: Secondary | ICD-10-CM

## 2019-02-16 DIAGNOSIS — Z7984 Long term (current) use of oral hypoglycemic drugs: Secondary | ICD-10-CM | POA: Diagnosis not present

## 2019-02-16 DIAGNOSIS — F319 Bipolar disorder, unspecified: Secondary | ICD-10-CM

## 2019-02-16 LAB — COMPREHENSIVE METABOLIC PANEL
ALT: 16 U/L (ref 0–44)
AST: 18 U/L (ref 15–41)
Albumin: 4.7 g/dL (ref 3.5–5.0)
Alkaline Phosphatase: 112 U/L (ref 38–126)
Anion gap: 19 — ABNORMAL HIGH (ref 5–15)
BUN: 17 mg/dL (ref 6–20)
CO2: 15 mmol/L — ABNORMAL LOW (ref 22–32)
Calcium: 9.2 mg/dL (ref 8.9–10.3)
Chloride: 96 mmol/L — ABNORMAL LOW (ref 98–111)
Creatinine, Ser: 1.05 mg/dL (ref 0.61–1.24)
GFR calc Af Amer: >60 mL/min (ref >60)
GFR calc non Af Amer: >60 mL/min (ref >60)
Glucose, Bld: 320 mg/dL — ABNORMAL HIGH (ref 70–99)
Potassium: 3.9 mmol/L (ref 3.5–5.1)
Sodium: 130 mmol/L — ABNORMAL LOW (ref 135–145)
Total Bilirubin: 1.4 mg/dL — ABNORMAL HIGH (ref 0.3–1.2)
Total Protein: 8 g/dL (ref 6.5–8.1)

## 2019-02-16 LAB — URINALYSIS, ROUTINE W REFLEX MICROSCOPIC
Bacteria, UA: NONE SEEN
Bilirubin Urine: NEGATIVE
Glucose, UA: >=500 mg/dL — AB
Hgb urine dipstick: NEGATIVE
Ketones, ur: 80 mg/dL — AB
Leukocytes,Ua: NEGATIVE
Nitrite: NEGATIVE
Protein, ur: NEGATIVE mg/dL
Specific Gravity, Urine: 1.02 (ref 1.005–1.030)
pH: 5 (ref 5.0–8.0)

## 2019-02-16 LAB — I-STAT CHEM 8, ED
BUN: 17 mg/dL (ref 6–20)
Calcium, Ion: 1.22 mmol/L (ref 1.15–1.40)
Chloride: 97 mmol/L — ABNORMAL LOW (ref 98–111)
Creatinine, Ser: 0.7 mg/dL (ref 0.61–1.24)
Glucose, Bld: 320 mg/dL — ABNORMAL HIGH (ref 70–99)
HCT: 46 % (ref 39.0–52.0)
Hemoglobin: 15.6 g/dL (ref 13.0–17.0)
Potassium: 3.9 mmol/L (ref 3.5–5.1)
Sodium: 132 mmol/L — ABNORMAL LOW (ref 135–145)
TCO2: 17 mmol/L — ABNORMAL LOW (ref 22–32)

## 2019-02-16 LAB — BLOOD GAS, ARTERIAL
Acid-base deficit: 11.5 mmol/L — ABNORMAL HIGH (ref 0.0–2.0)
Bicarbonate: 13.1 mmol/L — ABNORMAL LOW (ref 20.0–28.0)
Drawn by: 270211
O2 Saturation: 98.8 %
Patient temperature: 98.6
pCO2 arterial: 26.9 mmHg — ABNORMAL LOW (ref 32.0–48.0)
pH, Arterial: 7.309 — ABNORMAL LOW (ref 7.350–7.450)
pO2, Arterial: 147 mmHg — ABNORMAL HIGH (ref 83.0–108.0)

## 2019-02-16 LAB — BASIC METABOLIC PANEL
Anion gap: 14 (ref 5–15)
Anion gap: 12 (ref 5–15)
Anion gap: 14 (ref 5–15)
BUN: 12 mg/dL (ref 6–20)
BUN: 11 mg/dL (ref 6–20)
BUN: 9 mg/dL (ref 6–20)
CO2: 18 mmol/L — ABNORMAL LOW (ref 22–32)
CO2: 19 mmol/L — ABNORMAL LOW (ref 22–32)
CO2: 15 mmol/L — ABNORMAL LOW (ref 22–32)
Calcium: 8.7 mg/dL — ABNORMAL LOW (ref 8.9–10.3)
Calcium: 8.6 mg/dL — ABNORMAL LOW (ref 8.9–10.3)
Calcium: 8.4 mg/dL — ABNORMAL LOW (ref 8.9–10.3)
Chloride: 103 mmol/L (ref 98–111)
Chloride: 103 mmol/L (ref 98–111)
Chloride: 103 mmol/L (ref 98–111)
Creatinine, Ser: 0.75 mg/dL (ref 0.61–1.24)
Creatinine, Ser: 0.67 mg/dL (ref 0.61–1.24)
Creatinine, Ser: 0.74 mg/dL (ref 0.61–1.24)
GFR calc Af Amer: >60 mL/min (ref >60)
GFR calc Af Amer: >60 mL/min (ref >60)
GFR calc Af Amer: >60 mL/min (ref >60)
GFR calc non Af Amer: >60 mL/min (ref >60)
GFR calc non Af Amer: >60 mL/min (ref >60)
GFR calc non Af Amer: >60 mL/min (ref >60)
Glucose, Bld: 188 mg/dL — ABNORMAL HIGH (ref 70–99)
Glucose, Bld: 150 mg/dL — ABNORMAL HIGH (ref 70–99)
Glucose, Bld: 198 mg/dL — ABNORMAL HIGH (ref 70–99)
Potassium: 4.3 mmol/L (ref 3.5–5.1)
Potassium: 4.4 mmol/L (ref 3.5–5.1)
Potassium: 4 mmol/L (ref 3.5–5.1)
Sodium: 135 mmol/L (ref 135–145)
Sodium: 134 mmol/L — ABNORMAL LOW (ref 135–145)
Sodium: 132 mmol/L — ABNORMAL LOW (ref 135–145)

## 2019-02-16 LAB — GLUCOSE, CAPILLARY
Glucose-Capillary: 166 mg/dL — ABNORMAL HIGH (ref 70–99)
Glucose-Capillary: 144 mg/dL — ABNORMAL HIGH (ref 70–99)
Glucose-Capillary: 138 mg/dL — ABNORMAL HIGH (ref 70–99)
Glucose-Capillary: 135 mg/dL — ABNORMAL HIGH (ref 70–99)
Glucose-Capillary: 130 mg/dL — ABNORMAL HIGH (ref 70–99)
Glucose-Capillary: 170 mg/dL — ABNORMAL HIGH (ref 70–99)
Glucose-Capillary: 198 mg/dL — ABNORMAL HIGH (ref 70–99)

## 2019-02-16 LAB — CBG MONITORING, ED
Glucose-Capillary: 345 mg/dL — ABNORMAL HIGH (ref 70–99)
Glucose-Capillary: 348 mg/dL — ABNORMAL HIGH (ref 70–99)
Glucose-Capillary: 192 mg/dL — ABNORMAL HIGH (ref 70–99)
Glucose-Capillary: 183 mg/dL — ABNORMAL HIGH (ref 70–99)

## 2019-02-16 LAB — CBC
HCT: 42.7 % (ref 39.0–52.0)
Hemoglobin: 13.8 g/dL (ref 13.0–17.0)
MCH: 28 pg (ref 26.0–34.0)
MCHC: 32.3 g/dL (ref 30.0–36.0)
MCV: 86.8 fL (ref 80.0–100.0)
Platelets: 332 10*3/uL (ref 150–400)
RBC: 4.92 MIL/uL (ref 4.22–5.81)
RDW: 11.8 % (ref 11.5–15.5)
WBC: 10.5 10*3/uL (ref 4.0–10.5)
nRBC: 0 % (ref 0.0–0.2)

## 2019-02-16 LAB — D-DIMER, QUANTITATIVE: D-Dimer, Quant: 1.26 ug/mL-FEU — ABNORMAL HIGH (ref 0.00–0.50)

## 2019-02-16 LAB — TROPONIN I (HIGH SENSITIVITY): Troponin I (High Sensitivity): <2 ng/L (ref <18)

## 2019-02-16 LAB — SARS CORONAVIRUS 2 (TAT 6-24 HRS): SARS Coronavirus 2: NEGATIVE

## 2019-02-16 LAB — MRSA PCR SCREENING: MRSA by PCR: NEGATIVE

## 2019-02-16 LAB — LIPASE, BLOOD: Lipase: 17 U/L (ref 11–51)

## 2019-02-16 MED ORDER — CHLORHEXIDINE GLUCONATE CLOTH 2 % EX PADS
6.0000 | MEDICATED_PAD | Freq: Every day | CUTANEOUS | Status: DC
  Administered 2019-02-16: 6 via TOPICAL

## 2019-02-16 MED ORDER — INSULIN REGULAR(HUMAN) IN NACL 100-0.9 UT/100ML-% IV SOLN
INTRAVENOUS | Status: DC
  Administered 2019-02-16: 15:00:00 1.3 [IU]/h via INTRAVENOUS
  Filled 2019-02-16: qty 100

## 2019-02-16 MED ORDER — ENOXAPARIN SODIUM 40 MG/0.4ML ~~LOC~~ SOLN
40.0000 mg | SUBCUTANEOUS | Status: DC
  Administered 2019-02-16: 17:00:00 40 mg via SUBCUTANEOUS
  Filled 2019-02-16: qty 0.4

## 2019-02-16 MED ORDER — SODIUM CHLORIDE (PF) 0.9 % IJ SOLN
INTRAMUSCULAR | Status: AC
  Administered 2019-02-16: 10 mL
  Filled 2019-02-16: qty 50

## 2019-02-16 MED ORDER — IOHEXOL 350 MG/ML SOLN
100.0000 mL | Freq: Once | INTRAVENOUS | Status: AC | PRN
  Administered 2019-02-16: 20:00:00 100 mL via INTRAVENOUS

## 2019-02-16 MED ORDER — TRAMADOL HCL 50 MG PO TABS
50.0000 mg | ORAL_TABLET | Freq: Four times a day (QID) | ORAL | Status: DC | PRN
  Administered 2019-02-16: 20:00:00 50 mg via ORAL
  Filled 2019-02-16: qty 1

## 2019-02-16 MED ORDER — AMPHETAMINE-DEXTROAMPHETAMINE 20 MG PO TABS
30.0000 mg | ORAL_TABLET | Freq: Two times a day (BID) | ORAL | Status: DC
  Administered 2019-02-17: 30 mg via ORAL
  Filled 2019-02-16: qty 1

## 2019-02-16 MED ORDER — QUETIAPINE FUMARATE 50 MG PO TABS
150.0000 mg | ORAL_TABLET | Freq: Every day | ORAL | Status: DC
  Administered 2019-02-16: 21:00:00 150 mg via ORAL
  Filled 2019-02-16: qty 1

## 2019-02-16 MED ORDER — GABAPENTIN 100 MG PO CAPS
100.0000 mg | ORAL_CAPSULE | Freq: Three times a day (TID) | ORAL | Status: DC
  Administered 2019-02-16 – 2019-02-17 (×3): 100 mg via ORAL
  Filled 2019-02-16 (×3): qty 1

## 2019-02-16 MED ORDER — NICOTINE 21 MG/24HR TD PT24
21.0000 mg | MEDICATED_PATCH | Freq: Every day | TRANSDERMAL | Status: DC
  Administered 2019-02-16 – 2019-02-17 (×2): 21 mg via TRANSDERMAL
  Filled 2019-02-16 (×2): qty 1

## 2019-02-16 MED ORDER — SODIUM CHLORIDE 0.9 % IV BOLUS: 1000.0000 mL | Freq: Once | INTRAVENOUS | Status: DC

## 2019-02-16 MED ORDER — POTASSIUM CHLORIDE 10 MEQ/100ML IV SOLN
10.0000 meq | INTRAVENOUS | Status: DC
  Administered 2019-02-16 (×2): 10 meq via INTRAVENOUS
  Filled 2019-02-16 (×2): qty 100

## 2019-02-16 MED ORDER — SODIUM CHLORIDE 0.9 % IV BOLUS
1000.0000 mL | Freq: Once | INTRAVENOUS | Status: AC
  Administered 2019-02-16: 1000 mL via INTRAVENOUS

## 2019-02-16 MED ORDER — AMPHETAMINE-DEXTROAMPHETAMINE 10 MG PO TABS: 30.0000 mg | ORAL_TABLET | Freq: Two times a day (BID) | ORAL | Status: DC

## 2019-02-16 MED ORDER — MORPHINE SULFATE (PF) 4 MG/ML IV SOLN
4.0000 mg | Freq: Once | INTRAVENOUS | Status: AC
  Administered 2019-02-16: 16:00:00 4 mg via INTRAVENOUS
  Filled 2019-02-16: qty 1

## 2019-02-16 MED ORDER — SODIUM CHLORIDE 0.9% FLUSH
3.0000 mL | Freq: Once | INTRAVENOUS | Status: AC
  Administered 2019-02-16: 3 mL via INTRAVENOUS

## 2019-02-16 MED ORDER — ACETAMINOPHEN 650 MG RE SUPP: 650.0000 mg | Freq: Four times a day (QID) | RECTAL | Status: DC | PRN

## 2019-02-16 MED ORDER — PANTOPRAZOLE SODIUM 40 MG IV SOLR
40.0000 mg | Freq: Two times a day (BID) | INTRAVENOUS | Status: DC
  Administered 2019-02-16 – 2019-02-17 (×2): 40 mg via INTRAVENOUS
  Filled 2019-02-16 (×2): qty 40

## 2019-02-16 MED ORDER — ONDANSETRON HCL 4 MG/2ML IJ SOLN: 4.0000 mg | Freq: Four times a day (QID) | INTRAMUSCULAR | Status: DC | PRN

## 2019-02-16 MED ORDER — ACETAMINOPHEN 325 MG PO TABS: 650.0000 mg | ORAL_TABLET | Freq: Four times a day (QID) | ORAL | Status: DC | PRN

## 2019-02-16 MED ORDER — ONDANSETRON HCL 4 MG PO TABS: 4.0000 mg | ORAL_TABLET | Freq: Four times a day (QID) | ORAL | Status: DC | PRN

## 2019-02-16 MED ORDER — ONDANSETRON HCL 4 MG/2ML IJ SOLN
4.0000 mg | Freq: Once | INTRAMUSCULAR | Status: AC
  Administered 2019-02-16: 13:00:00 4 mg via INTRAVENOUS
  Filled 2019-02-16: qty 2

## 2019-02-16 MED ORDER — DEXTROSE-NACL 5-0.45 % IV SOLN
INTRAVENOUS | Status: DC
  Administered 2019-02-16 – 2019-02-17 (×3): via INTRAVENOUS

## 2019-02-16 NOTE — ED Notes (Signed)
Transported pt and all belongings to [facility] [address]

## 2019-02-16 NOTE — ED Provider Notes (Signed)
[facility]-EMERGENCY DEPT Provider Note   CSN: [id number] Arrival date & time: 02/16/19  1117     History   Chief Complaint Chief Complaint  Patient presents with  . Nausea  . Emesis  . Abdominal Pain    HPI [name] Wall is a 28 y.o. male with PMHx type 1 Diabetes, ADD, Bipolar disorder, and pancreatitis who presents to the ED today complaining of gradual onset, constant, achy, epigastric abdominal pain x 2 days.  Complains of nausea and nonbloody nonbilious emesis.  Reports he is unable keep anything down including liquids.  Patient reports that prior to his vomiting beginning he had not missed any of his regular insulin doses.  States he has not taken insulin for the past 2 days given he has been unable to keep anything down.  Reports that he was recently in contact with a coworker whose spouse had a GI bug.  Denies being in contact with the spouse.  No suspicious food intake.  No recent antibiotic use.  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  he denies fever, chills, diarrhea, constipation, blood in stool, chest pain, shortness of breath, any other associated symptoms.        Past Medical History:  Diagnosis Date  . ADD (attention deficit disorder)   . Bipolar disorder (HCC)   . Diabetes mellitus without complication (HCC)   . Pancreatitis     Patient Active Problem List   Diagnosis Date Noted  . Diabetic ketoacidosis without coma associated with type 1 diabetes mellitus (HCC) 02/13/2018  . Transaminitis 02/13/2018  . Alcohol-induced acute pancreatitis 02/13/2018  . Alcohol abuse with physiological dependence (HCC) 02/13/2018  . DKA (diabetic ketoacidoses) (HCC) 02/13/2018  . Diabetes mellitus without complication (HCC) 02/14/2016  . Fracture of fifth metacarpal bone of right hand 07/07/2015    Past Surgical History:  Procedure Laterality Date  . Arm surgery    .  FINGER SURGERY          Home Medications    Prior to Admission medications   Medication Sig Start Date End Date Taking? Authorizing Provider  amphetamine-dextroamphetamine (ADDERALL) 30 MG tablet Take 30 mg by mouth 2 (two) times daily.      [provider]  glucagon (GLUCAGON EMERGENCY) 1 MG injection Inject 1 mg into the muscle once as needed for up to 1 dose. 02/21/18   Romero BellingEllison, Sean, MD  glucose blood (ACCU-CHEK GUIDE) test strip Used to check blood sugars 4x daily. 09/13/17   Romero BellingEllison, Sean, MD  insulin aspart (NOVOLOG FLEXPEN) 100 UNIT/ML FlexPen Inject 5 Units into the skin 3 (three) times daily with meals. And pen needles 4/day 02/21/18   Romero BellingEllison, Sean, MD  insulin degludec (TRESIBA FLEXTOUCH) 100 UNIT/ML SOPN FlexTouch Pen Inject 0.5 mLs (50 Units total) into the skin daily. 09/10/17   Romero BellingEllison, Sean, MD  nicotine (NICODERM CQ - DOSED IN MG/24 HOURS) 21 mg/24hr patch Place 1 patch (21 mg total) onto the skin daily. 02/13/16   Romero BellingEllison, Sean, MD  pantoprazole (PROTONIX) 40 MG tablet Take 1 tablet (40 mg total) by mouth daily. 02/15/18 02/15/19  Marinda ElkFeliz Ortiz, Abraham, MD    Family History Family History  Problem Relation Age of Onset  . Diabetes Father   . Cancer Other   . CAD Other     Social History Social History   Tobacco Use  . Smoking status: Current Every Day Smoker  Packs/day: 0.50    Types: Cigarettes  . Smokeless tobacco: Former Network engineer Use Topics  . Alcohol use: Yes    Comment: pt states half of a fifth/day  . Drug use: No     Allergies   Patient has no known allergies.   Review of Systems Review of Systems  Constitutional: Negative for chills and fever.  HENT: Negative for congestion.   Eyes: Negative for visual disturbance.  Respiratory: Negative for cough and shortness of breath.   Cardiovascular: Negative for chest pain.  Gastrointestinal: Positive for abdominal pain, nausea and vomiting. Negative for constipation and diarrhea.   Genitourinary: Negative for difficulty urinating.  Musculoskeletal: Negative for myalgias.  Skin: Negative for rash.  Neurological: Negative for headaches.     Physical Exam Updated Vital Signs BP 102/75   Pulse (!) 107   Temp 98.2 F (36.8 C) (Oral)   Resp 18   SpO2 100%   Physical Exam Vitals signs and nursing note reviewed.  Constitutional:      Appearance: He is not ill-appearing or diaphoretic.  HENT:     Head: Normocephalic and atraumatic.  Eyes:     Conjunctiva/sclera: Conjunctivae normal.  Neck:     Musculoskeletal: Neck supple.  Cardiovascular:     Rate and Rhythm: Normal rate and regular rhythm.     Heart sounds: Normal heart sounds.  Pulmonary:     Effort: Pulmonary effort is normal.     Breath sounds: Normal breath sounds. No wheezing, rhonchi or rales.  Abdominal:     Palpations: Abdomen is soft.     Tenderness: There is abdominal tenderness in the epigastric area. There is no right CVA tenderness, left CVA tenderness, guarding or rebound.  Skin:    General: Skin is warm and dry.  Neurological:     Mental Status: He is alert.      ED Treatments / Results  Labs (all labs ordered are listed, but only abnormal results are displayed) Labs Reviewed  COMPREHENSIVE METABOLIC PANEL - Abnormal; Notable for the following components:      Result Value   Sodium 130 (*)    Chloride 96 (*)    CO2 15 (*)    Glucose, Bld 320 (*)    Total Bilirubin 1.4 (*)    Anion gap 19 (*)    All other components within normal limits  URINALYSIS, ROUTINE W REFLEX MICROSCOPIC - Abnormal; Notable for the following components:   Color, Urine STRAW (*)    Glucose, UA >=500 (*)    Ketones, ur 80 (*)    All other components within normal limits  BLOOD GAS, ARTERIAL - Abnormal; Notable for the following components:   pH, Arterial 7.309 (*)    pCO2 arterial 26.9 (*)    pO2, Arterial 147 (*)    Bicarbonate 13.1 (*)    Acid-base deficit 11.5 (*)    All other components within  normal limits  CBG MONITORING, ED - Abnormal; Notable for the following components:   Glucose-Capillary 348 (*)    All other components within normal limits  I-STAT CHEM 8, ED - Abnormal; Notable for the following components:   Sodium 132 (*)    Chloride 97 (*)    Glucose, Bld 320 (*)    TCO2 17 (*)    All other components within normal limits  CBG MONITORING, ED - Abnormal; Notable for the following components:   Glucose-Capillary 345 (*)    All other components within normal limits  CBG MONITORING,  ED - Abnormal; Notable for the following components:   Glucose-Capillary 192 (*)    All other components within normal limits  SARS CORONAVIRUS 2 (TAT 6-24 HRS)  LIPASE, BLOOD  CBC    EKG EKG Interpretation  Date/Time:  Monday February 16 2019 12:29:02 EST Ventricular Rate:  107 PR Interval:    QRS Duration: 116 QT Interval:  350 QTC Calculation: 467 R Axis:   -73 Text Interpretation: Sinus tachycardia Incomplete RBBB and LAFB Low voltage, precordial leads Consider anterior infarct No acute changes Confirmed by Derwood Kaplan [phone]) on 02/16/2019 1:09:06 PM   Radiology No results found.  Procedures Procedures (including critical care time)  Medications Ordered in ED Medications  insulin regular, human (MYXREDLIN) 100 units/ 100 mL infusion (1.3 Units/hr Intravenous New Bag/Given 02/16/19 1451)  potassium chloride 10 mEq in 100 mL IVPB (10 mEq Intravenous New Bag/Given 02/16/19 1455)  dextrose 5 %-0.45 % sodium chloride infusion ( Intravenous New Bag/Given 02/16/19 1456)  sodium chloride flush (NS) 0.9 % injection 3 mL (3 mLs Intravenous Given 02/16/19 1316)  sodium chloride 0.9 % bolus 1,000 mL (0 mLs Intravenous Stopped 02/16/19 1422)  ondansetron (ZOFRAN) injection 4 mg (4 mg Intravenous Given 02/16/19 1325)     Initial Impression / Assessment and Plan / ED Course  I have reviewed the triage vital signs and the nursing notes.  Pertinent labs & imaging results that were  available during my care of the patient were reviewed by me and considered in my medical decision making (see chart for details).    28 year old male who presents to the ED today complaining of epigastric abdominal pain, nausea, vomiting that all started yesterday.  He is 1 diabetic, states he has not taken his insulin for the past 2 days as he has not been able to keep anything down.  Denies missing any insulin prior to the nausea and vomiting beginning.  States that he was around a coworker who spouse recently had a GI bug, she tested negative for Covid.  She states that his blood sugars have been running controlled despite not taking insulin although CBG on arrival 345.  He does have a history of pancreatitis although he denies alcohol use within the past year.  There is concern for DKA.  Will obtain EKG, i-STAT Chem-8 prior to initiating insulin.  Exam patient has tenderness to his epigastrium, there are no peritoneal signs.  Doubt acute abdomen today.  Vital signs - afebrile, mildly tachycardic at 105 and mildly tachypneic. Doubt infection today.   ABG - Ph 7.309; bicarb at 13.1, and CO2 26.9 consistent with metabolic acidosis  Chem 8 - Glucose 320, Potassium 3.9   Insulin infusion initiated with KCL. Pt will need to be admitted for DKA.   Repeat CBG 192; will switch to D5 1/2 NS.   Discussed case with Dr. Sunnie Nielsen who will see patient for admission.        Final Clinical Impressions(s) / ED Diagnoses   Final diagnoses:  Diabetic ketoacidosis without coma associated with type 1 diabetes mellitus [facility])  Epigastric pain    ED Discharge Orders    None       Tanda Rockers, PA-C 02/16/19 1504    Derwood Kaplan, MD 02/17/19 [phone]

## 2019-02-16 NOTE — ED Notes (Signed)
Pt made aware need urine sample. Bedside urinal provided

## 2019-02-16 NOTE — ED Notes (Signed)
PT have been made aware of urine sample 

## 2019-02-16 NOTE — H&P (Signed)
History and Physical  [name] Wall GHW:[id number] DOB: Oct 08, 1990 DOA: 02/16/2019  PCP: Camille Bal, PA-C Patient coming from: Home   I have personally briefly reviewed patient's old medical records in [place]   Chief Complaint: Nausea vomiting abdominal pain.  HPI: [name] Wall is a 28 y.o. male with past medical history significant for diabetes type 1, bipolar, attention deficit disorder prior history of pancreatitis remote use of alcohol who presents complaining of nausea vomiting abdominal pain that started the day prior of admission.  Patient reports epigastric pain,  stabbing in quality, 8 out of 10 in intensity, at times get worse.  He reports contact with a person with a stomach bug.  He occasionally smoke.  He denies alcohol intake.  He does report of mild shortness of breath.  He denies cough, fever, sore throat, loss of taste.  He relate that he has been using his insulin last time that he use it was yesterday.  Evaluation in the ED: Sodium 130, potassium 3.9, chloride 96, CO2 15, glucose 320, anion gap 19, bilirubin 1.4, hemoglobin 13, white blood cell 10.5, platelets 332 glucose 348.  pH 7.3 SARS coronavirus 2 pending.  Patient was found to be in DKA, he received 2 L of IV fluids, started on IV insulin drip and IV potassium in the emergency department.   Review of Systems: All systems reviewed and apart from history of presenting illness, are negative.  Past Medical History:  Diagnosis Date  . ADD (attention deficit disorder)   . Bipolar disorder ([place])   . Diabetes mellitus without complication ([place])   . Pancreatitis    Past Surgical History:  Procedure Laterality Date  . Arm surgery    . FINGER SURGERY     Social History:  reports that he has been smoking cigarettes. He has been smoking about 0.50 packs per day. He has quit using smokeless tobacco. He reports current alcohol use. He reports that he does not use drugs.   No Known  Allergies  Family History  Problem Relation Age of Onset  . Diabetes Father   . Cancer Other   . CAD Other     Prior to Admission medications   Medication Sig Start Date End Date Taking? Authorizing Provider  amphetamine-dextroamphetamine (ADDERALL) 30 MG tablet Take 30 mg by mouth 2 (two) times daily.      [provider]  glucagon (GLUCAGON EMERGENCY) 1 MG injection Inject 1 mg into the muscle once as needed for up to 1 dose. 02/21/18   Renato Shin, MD  glucose blood (ACCU-CHEK GUIDE) test strip Used to check blood sugars 4x daily. 09/13/17   Renato Shin, MD  insulin aspart (NOVOLOG FLEXPEN) 100 UNIT/ML FlexPen Inject 5 Units into the skin 3 (three) times daily with meals. And pen needles 4/day 02/21/18   Renato Shin, MD  insulin degludec (TRESIBA FLEXTOUCH) 100 UNIT/ML SOPN FlexTouch Pen Inject 0.5 mLs (50 Units total) into the skin daily. 09/10/17   Renato Shin, MD  nicotine (NICODERM CQ - DOSED IN MG/24 HOURS) 21 mg/24hr patch Place 1 patch (21 mg total) onto the skin daily. 02/13/16   Renato Shin, MD  pantoprazole (PROTONIX) 40 MG tablet Take 1 tablet (40 mg total) by mouth daily. 02/15/18 02/15/19  Charlynne Cousins, MD   Physical Exam: Vitals:   02/16/19 1530 02/16/19 1600 02/16/19 1609 02/16/19 1700  BP: 120/79  115/76   Pulse: 100 (!) 101 99   Resp: 19 (!) 22  20   Temp:      TempSrc:      SpO2: 100% 100% 100%   Weight:    88 kg  Height:    5\' 9"  (1.753 m)     General exam: Moderately built and nourished patient, lying comfortably supine on the gurney in no obvious distress.  Head, eyes and ENT: Nontraumatic and normocephalic. Pupils equally reacting to light and accommodation. Oral mucosa moist.  Neck: Supple. No JVD, carotid bruit or thyromegaly.  Lymphatics: No lymphadenopathy.  Respiratory system: Clear to auscultation. No increased work of breathing.  Cardiovascular system: S1 and S2 heard, RRR. No JVD, murmurs, gallops, clicks or pedal edema.   Gastrointestinal system: Abdomen is nondistended, soft and epigastric tender. Normal bowel sounds heard. No organomegaly or masses appreciated.  Central nervous system: Alert and oriented. No focal neurological deficits.  Extremities: Symmetric 5 x 5 power. Peripheral pulses symmetrically felt.   Skin: No rashes or acute findings.  Musculoskeletal system: Negative exam.  Psychiatry: Pleasant and cooperative.   Labs on Admission:  Basic Metabolic Panel: Recent Labs  Lab 02/16/19 1140 02/16/19 1311 02/16/19 1657  NA 130* 132* 135  K 3.9 3.9 4.3  CL 96* 97* 103  CO2 15*  --  18*  GLUCOSE 320* 320* 188*  BUN 17 17 12   CREATININE 1.05 0.70 0.75  CALCIUM 9.2  --  8.7*   Liver Function Tests: Recent Labs  Lab 02/16/19 1140  AST 18  ALT 16  ALKPHOS 112  BILITOT 1.4*  PROT 8.0  ALBUMIN 4.7   Recent Labs  Lab 02/16/19 1140  LIPASE 17   No results for input(s): AMMONIA in the last 168 hours. CBC: Recent Labs  Lab 02/16/19 1140 02/16/19 1311  WBC 10.5  --   HGB 13.8 15.6  HCT 42.7 46.0  MCV 86.8  --   PLT 332  --    Cardiac Enzymes: No results for input(s): CKTOTAL, CKMB, CKMBINDEX, TROPONINI in the last 168 hours.  BNP (last 3 results) No results for input(s): PROBNP in the last 8760 hours. CBG: Recent Labs  Lab 02/16/19 1143 02/16/19 1212 02/16/19 1421 02/16/19 1548  GLUCAP 348* 345* 192* 183*    Radiological Exams on Admission: No results found.  EKG: Independently reviewed.  Tachycardia incomplete right bundle branch block  Assessment/Plan Active Problems:   Diabetic ketoacidosis without coma associated with type 1 diabetes mellitus (HCC)   Metabolic acidosis due to diabetes mellitus (HCC)   Bipolar disorder (HCC)   ADD (attention deficit disorder)   Hyponatremia   DKA (diabetic ketoacidoses) (HCC)   1-Diabetes type 1 DKA -Patient presented with nausea vomiting epigastric pain, found to be in DKA.  Presented with anion gap at 26,  bicarb 10, pH 7.2. -Patient received IV fluids in the ED. -Continue with IV fluids, insulin drip until CO2 more than 21 and Close. -Continue with insulin drip.  When gap close need to transition to long-acting insulin. -No evidence of acute infection other than none gastroenteritis  2-Dyspnea; -Patient reports shortness of breath, he was tachycardic, in DKA, EKG with bundle branch block. -Check troponin and D-dimer.  -D dimer positive will proceed with CT angio.   3-Epigastric pain;  Suspect gastroenteritis vs Related to DKA.  IV Protonix, support care.   4-Hyponatremia; Related to hypovolemia.  Continue with NS IV fluids.   5-Bipolar; continue with Seroquel.     DVT Prophylaxis: Lovenox Code Status: Full code Family Communication: Care discussed with patient who is alert  and oriented and able to understand plan of care Disposition Plan: Admit to the stepdown unit for treatment of DKA, needs aggressive IV fluids, insulin drip.  Time spent: 75 minutes.    Alba Cory MD [facility]   02/16/2019, 5:35 PM

## 2019-02-16 NOTE — ED Triage Notes (Signed)
Patient here from home with complaints of abd pain, nausea, vomiting since yesterday. Type 1 diabetic.

## 2019-02-17 DIAGNOSIS — R1013 Epigastric pain: Secondary | ICD-10-CM | POA: Diagnosis not present

## 2019-02-17 DIAGNOSIS — E1010 Type 1 diabetes mellitus with ketoacidosis without coma: Secondary | ICD-10-CM | POA: Diagnosis not present

## 2019-02-17 DIAGNOSIS — F988 Other specified behavioral and emotional disorders with onset usually occurring in childhood and adolescence: Secondary | ICD-10-CM | POA: Diagnosis not present

## 2019-02-17 LAB — BASIC METABOLIC PANEL
Anion gap: 10 (ref 5–15)
Anion gap: 12 (ref 5–15)
Anion gap: 11 (ref 5–15)
BUN: 8 mg/dL (ref 6–20)
BUN: 8 mg/dL (ref 6–20)
BUN: 8 mg/dL (ref 6–20)
CO2: 18 mmol/L — ABNORMAL LOW (ref 22–32)
CO2: 18 mmol/L — ABNORMAL LOW (ref 22–32)
CO2: 20 mmol/L — ABNORMAL LOW (ref 22–32)
Calcium: 8.7 mg/dL — ABNORMAL LOW (ref 8.9–10.3)
Calcium: 8.8 mg/dL — ABNORMAL LOW (ref 8.9–10.3)
Calcium: 9 mg/dL (ref 8.9–10.3)
Chloride: 106 mmol/L (ref 98–111)
Chloride: 105 mmol/L (ref 98–111)
Chloride: 104 mmol/L (ref 98–111)
Creatinine, Ser: 0.64 mg/dL (ref 0.61–1.24)
Creatinine, Ser: 0.65 mg/dL (ref 0.61–1.24)
Creatinine, Ser: 0.69 mg/dL (ref 0.61–1.24)
GFR calc Af Amer: >60 mL/min (ref >60)
GFR calc Af Amer: >60 mL/min (ref >60)
GFR calc Af Amer: >60 mL/min (ref >60)
GFR calc non Af Amer: >60 mL/min (ref >60)
GFR calc non Af Amer: >60 mL/min (ref >60)
GFR calc non Af Amer: >60 mL/min (ref >60)
Glucose, Bld: 152 mg/dL — ABNORMAL HIGH (ref 70–99)
Glucose, Bld: 145 mg/dL — ABNORMAL HIGH (ref 70–99)
Glucose, Bld: 228 mg/dL — ABNORMAL HIGH (ref 70–99)
Potassium: 3.6 mmol/L (ref 3.5–5.1)
Potassium: 3.7 mmol/L (ref 3.5–5.1)
Potassium: 4.1 mmol/L (ref 3.5–5.1)
Sodium: 134 mmol/L — ABNORMAL LOW (ref 135–145)
Sodium: 135 mmol/L (ref 135–145)
Sodium: 135 mmol/L (ref 135–145)

## 2019-02-17 LAB — CBC
HCT: 37.4 % — ABNORMAL LOW (ref 39.0–52.0)
Hemoglobin: 12 g/dL — ABNORMAL LOW (ref 13.0–17.0)
MCH: 28 pg (ref 26.0–34.0)
MCHC: 32.1 g/dL (ref 30.0–36.0)
MCV: 87.4 fL (ref 80.0–100.0)
Platelets: 262 10*3/uL (ref 150–400)
RBC: 4.28 MIL/uL (ref 4.22–5.81)
RDW: 11.8 % (ref 11.5–15.5)
WBC: 6 10*3/uL (ref 4.0–10.5)
nRBC: 0 % (ref 0.0–0.2)

## 2019-02-17 LAB — GLUCOSE, CAPILLARY
Glucose-Capillary: 124 mg/dL — ABNORMAL HIGH (ref 70–99)
Glucose-Capillary: 134 mg/dL — ABNORMAL HIGH (ref 70–99)
Glucose-Capillary: 135 mg/dL — ABNORMAL HIGH (ref 70–99)
Glucose-Capillary: 141 mg/dL — ABNORMAL HIGH (ref 70–99)
Glucose-Capillary: 145 mg/dL — ABNORMAL HIGH (ref 70–99)
Glucose-Capillary: 152 mg/dL — ABNORMAL HIGH (ref 70–99)
Glucose-Capillary: 153 mg/dL — ABNORMAL HIGH (ref 70–99)
Glucose-Capillary: 170 mg/dL — ABNORMAL HIGH (ref 70–99)
Glucose-Capillary: 171 mg/dL — ABNORMAL HIGH (ref 70–99)
Glucose-Capillary: 180 mg/dL — ABNORMAL HIGH (ref 70–99)
Glucose-Capillary: 195 mg/dL — ABNORMAL HIGH (ref 70–99)
Glucose-Capillary: 202 mg/dL — ABNORMAL HIGH (ref 70–99)
Glucose-Capillary: 209 mg/dL — ABNORMAL HIGH (ref 70–99)
Glucose-Capillary: 209 mg/dL — ABNORMAL HIGH (ref 70–99)
Glucose-Capillary: 230 mg/dL — ABNORMAL HIGH (ref 70–99)

## 2019-02-17 LAB — RAPID URINE DRUG SCREEN, HOSP PERFORMED
Amphetamines: POSITIVE — AB
Barbiturates: NOT DETECTED
Benzodiazepines: NOT DETECTED
Cocaine: NOT DETECTED
Opiates: POSITIVE — AB
Tetrahydrocannabinol: NOT DETECTED

## 2019-02-17 LAB — HEMOGLOBIN A1C
Hgb A1c MFr Bld: 9.2 % — ABNORMAL HIGH (ref 4.8–5.6)
Mean Plasma Glucose: 217.34 mg/dL

## 2019-02-17 LAB — HIV ANTIBODY (ROUTINE TESTING W REFLEX): HIV Screen 4th Generation wRfx: NONREACTIVE

## 2019-02-17 MED ORDER — INSULIN ASPART 100 UNIT/ML ~~LOC~~ SOLN
0.0000 [IU] | Freq: Three times a day (TID) | SUBCUTANEOUS | Status: DC
  Administered 2019-02-17: 13:00:00 2 [IU] via SUBCUTANEOUS

## 2019-02-17 MED ORDER — INSULIN DEGLUDEC 100 UNIT/ML ~~LOC~~ SOPN: 40.0000 [IU] | PEN_INJECTOR | Freq: Every day | SUBCUTANEOUS | Status: DC

## 2019-02-17 MED ORDER — INSULIN GLARGINE 100 UNIT/ML ~~LOC~~ SOLN
40.0000 [IU] | Freq: Every day | SUBCUTANEOUS | Status: DC
  Administered 2019-02-17: 12:00:00 40 [IU] via SUBCUTANEOUS
  Filled 2019-02-17: qty 0.4

## 2019-02-17 MED ORDER — INSULIN GLARGINE 100 UNIT/ML ~~LOC~~ SOLN
40.0000 [IU] | Freq: Every day | SUBCUTANEOUS | Status: DC
  Filled 2019-02-17: qty 0.4

## 2019-02-17 MED ORDER — INSULIN ASPART 100 UNIT/ML ~~LOC~~ SOLN: 3.0000 [IU] | Freq: Three times a day (TID) | SUBCUTANEOUS | Status: DC

## 2019-02-17 NOTE — Plan of Care (Signed)
  Problem: Education: Goal: Knowledge of General Education information will improve Description: Including pain rating scale, medication(s)/side effects and non-pharmacologic comfort measures 02/17/2019 1258 by Hermelinda Dellen, RN Outcome: Completed/Met 02/17/2019 1257 by Hermelinda Dellen, RN Outcome: Adequate for Discharge   Problem: Health Behavior/Discharge Planning: Goal: Ability to manage health-related needs will improve 02/17/2019 1258 by Hermelinda Dellen, RN Outcome: Completed/Met 02/17/2019 1257 by Hermelinda Dellen, RN Outcome: Adequate for Discharge   Problem: Clinical Measurements: Goal: Ability to maintain clinical measurements within normal limits will improve 02/17/2019 1258 by Hermelinda Dellen, RN Outcome: Completed/Met 02/17/2019 1257 by Hermelinda Dellen, RN Outcome: Adequate for Discharge Goal: Will remain free from infection 02/17/2019 1258 by Hermelinda Dellen, RN Outcome: Completed/Met 02/17/2019 1257 by Hermelinda Dellen, RN Outcome: Adequate for Discharge Goal: Diagnostic test results will improve 02/17/2019 1258 by Hermelinda Dellen, RN Outcome: Completed/Met 02/17/2019 1257 by Hermelinda Dellen, RN Outcome: Adequate for Discharge Goal: Respiratory complications will improve 02/17/2019 1258 by Hermelinda Dellen, RN Outcome: Completed/Met 02/17/2019 1257 by Hermelinda Dellen, RN Outcome: Adequate for Discharge Goal: Cardiovascular complication will be avoided 02/17/2019 1258 by Hermelinda Dellen, RN Outcome: Completed/Met 02/17/2019 1257 by Hermelinda Dellen, RN Outcome: Adequate for Discharge   Problem: Activity: Goal: Risk for activity intolerance will decrease 02/17/2019 1258 by Hermelinda Dellen, RN Outcome: Completed/Met 02/17/2019 1257 by Hermelinda Dellen, RN Outcome: Adequate for Discharge   Problem: Nutrition: Goal: Adequate nutrition will be maintained 02/17/2019 1258 by Hermelinda Dellen, RN Outcome: Completed/Met 02/17/2019  1257 by Hermelinda Dellen, RN Outcome: Adequate for Discharge   Problem: Coping: Goal: Level of anxiety will decrease 02/17/2019 1258 by Hermelinda Dellen, RN Outcome: Completed/Met 02/17/2019 1257 by Hermelinda Dellen, RN Outcome: Adequate for Discharge   Problem: Elimination: Goal: Will not experience complications related to bowel motility 02/17/2019 1258 by Hermelinda Dellen, RN Outcome: Completed/Met 02/17/2019 1257 by Hermelinda Dellen, RN Outcome: Adequate for Discharge Goal: Will not experience complications related to urinary retention 02/17/2019 1258 by Hermelinda Dellen, RN Outcome: Completed/Met 02/17/2019 1257 by Hermelinda Dellen, RN Outcome: Adequate for Discharge   Problem: Pain Managment: Goal: General experience of comfort will improve 02/17/2019 1258 by Hermelinda Dellen, RN Outcome: Completed/Met 02/17/2019 1257 by Hermelinda Dellen, RN Outcome: Adequate for Discharge   Problem: Safety: Goal: Ability to remain free from injury will improve 02/17/2019 1258 by Hermelinda Dellen, RN Outcome: Completed/Met 02/17/2019 1257 by Hermelinda Dellen, RN Outcome: Adequate for Discharge   Problem: Skin Integrity: Goal: Risk for impaired skin integrity will decrease 02/17/2019 1258 by Hermelinda Dellen, RN Outcome: Completed/Met 02/17/2019 1257 by Hermelinda Dellen, RN Outcome: Adequate for Discharge

## 2019-02-17 NOTE — Discharge Summary (Signed)
Physician Discharge Summary  [name] Wall YDX:[id number] DOB: 03-Dec-1990 DOA: 02/16/2019  PCP: Camille Bal, PA-C  Admit date: 02/16/2019 Discharge date: 02/17/2019  Admitted From: home Disposition:  Home   Recommendations for Outpatient Follow-up:  1. Follow up with PCP in 1-2 weeks  Home Health: none Equipment/Devices: none  Discharge Condition: stable CODE STATUS: Full code Diet recommendation: diabetic   HPI: Per admitting MD, [name] Wall is a 28 y.o. male with past medical history significant for diabetes type 1, bipolar, attention deficit disorder prior history of pancreatitis remote use of alcohol who presents complaining of nausea vomiting abdominal pain that started the day prior of admission.  Patient reports epigastric pain,  stabbing in quality, 8 out of 10 in intensity, at times get worse.  He reports contact with a person with a stomach bug.  He occasionally smoke.  He denies alcohol intake.  He does report of mild shortness of breath.  He denies cough, fever, sore throat, loss of taste.  He relate that he has been using his insulin last time that he use it was yesterday. Evaluation in the ED: Sodium 130, potassium 3.9, chloride 96, CO2 15, glucose 320, anion gap 19, bilirubin 1.4, hemoglobin 13, white blood cell 10.5, platelets 332 glucose 348.  pH 7.3 SARS coronavirus 2 pending. Patient was found to be in DKA, he received 2 L of IV fluids, started on IV insulin drip and IV potassium in the emergency department.  Hospital Course: DKA and type 1 diabetes mellitus-patient was admitted to the hospital with DKA in the setting of known type 1 diabetes mellitus.  Cause for the DKA was unclear as he reports compliance with his medications at home.  He did have several nausea, vomiting and epigastric pain which may represent a nonspecific gastroenteritis that causes DKA versus effect of his DKA.  He was placed in stepdown, placed on insulin and fluids, his  acidosis resolved and gap closed.  He has returned back to baseline, able to eat without difficulties.  He was discharged home in stable condition and was advised to follow-up with PCP in 1 to 2 weeks. Dyspnea-this was in the ED, he did have a positive D-dimer and underwent a CT angiogram which was negative for acute findings.  His symptoms resolved Epigastric pain-possibly gastroenteritis, resolved, able to tolerate a regular diet Bipolar disorder-continue home medications  Discharge Diagnoses:  Active Problems:   Diabetic ketoacidosis without coma associated with type 1 diabetes mellitus ([place])   Metabolic acidosis due to diabetes mellitus ([place])   Bipolar disorder ([place])   ADD (attention deficit disorder)   Hyponatremia   DKA (diabetic ketoacidoses) ([place])     Discharge Instructions   Allergies as of 02/17/2019   No Known Allergies     Medication List    TAKE these medications   amphetamine-dextroamphetamine 30 MG tablet Commonly known as: ADDERALL Take 30 mg by mouth 2 (two) times daily.   gabapentin 100 MG capsule Commonly known as: NEURONTIN Take 100 mg by mouth 3 (three) times daily.   glucagon 1 MG injection Commonly known as: Glucagon Emergency Inject 1 mg into the muscle once as needed for up to 1 dose.   glucose blood test strip Commonly known as: Accu-Chek Guide Used to check blood sugars 4x daily.   insulin aspart 100 UNIT/ML FlexPen Commonly known as: NovoLOG FlexPen Inject 5 Units into the skin 3 (three) times daily with meals. And pen needles 4/day   insulin degludec 100 UNIT/ML  Sopn FlexTouch Pen Commonly known as: Evaristo Buryresiba FlexTouch Inject 0.5 mLs (50 Units total) into the skin daily.   nicotine 21 mg/24hr patch Commonly known as: NICODERM CQ - dosed in mg/24 hours Place 1 patch (21 mg total) onto the skin daily.   pantoprazole 40 MG tablet Commonly known as: Protonix Take 1 tablet (40 mg total) by mouth daily.   QUEtiapine 100 MG  tablet Commonly known as: SEROQUEL Take 150 mg by mouth at bedtime.       Consultations:  None   Procedures/Studies:  Ct Angio Chest Pe W Or Wo Contrast  Result Date: 02/16/2019 CLINICAL DATA:  Dyspnea on exertion EXAM: CT ANGIOGRAPHY CHEST WITH CONTRAST TECHNIQUE: Multidetector CT imaging of the chest was performed using the standard protocol during bolus administration of intravenous contrast. Multiplanar CT image reconstructions and MIPs were obtained to evaluate the vascular anatomy. CONTRAST:  100mL OMNIPAQUE IOHEXOL 350 MG/ML SOLN COMPARISON:  X-ray from earlier in the same day. FINDINGS: Cardiovascular: Thoracic aorta shows no significant aneurysmal dilatation or dissection. No cardiac enlargement is noted. The pulmonary artery shows a normal branching pattern. Opacification of the peripheral most branches is somewhat limited. No large central pulmonary embolus is noted. No significant coronary calcifications are seen. Mediastinum/Nodes: Thoracic inlet is within normal limits. No sizable hilar or mediastinal adenopathy is noted. The esophagus as visualized is within normal limits. Lungs/Pleura: Lungs are well aerated bilaterally. Minimal bibasilar atelectatic changes are seen. No focal infiltrate or sizable effusion is noted. No sizable parenchymal nodule is seen. Upper Abdomen: Visualized upper abdomen shows no acute abnormality. Musculoskeletal: No acute bony abnormality noted. Review of the MIP images confirms the above findings. IMPRESSION: No evidence of pulmonary embolism. Mild bibasilar atelectatic changes. No other focal abnormality is seen. Electronically Signed   By: Alcide CleverMark  Lukens M.D.   On: 02/16/2019 20:35   Dg Chest Port 1 View  Result Date: 02/16/2019 CLINICAL DATA:  Pt was admitted for DKA. Smoker, denies chest complaints. EXAM: PORTABLE CHEST 1 VIEW COMPARISON:  None. FINDINGS: Normal mediastinum and cardiac silhouette. Normal pulmonary vasculature. No evidence of effusion,  infiltrate, or pneumothorax. No acute bony abnormality. IMPRESSION: Normal chest radiograph. Electronically Signed   By: Genevive BiStewart  Edmunds M.D.   On: 02/16/2019 18:02     Subjective: - no chest pain, shortness of breath, no abdominal pain, nausea or vomiting.   Discharge Exam: BP 116/72    Pulse (!) 104    Temp 98 F (36.7 C)    Resp 18    Ht 5\' 9"  (1.753 m)    Wt 88 kg    SpO2 100%    BMI 28.65 kg/m   General: Pt is alert, awake, not in acute distress Cardiovascular: RRR, S1/S2 +, no rubs, no gallops Respiratory: CTA bilaterally, no wheezing, no rhonchi Abdominal: Soft, NT, ND, bowel sounds + Extremities: no edema, no cyanosis    The results of significant diagnostics from this hospitalization (including imaging, microbiology, ancillary and laboratory) are listed below for reference.     Microbiology: Recent Results (from the past 240 hour(s))  SARS CORONAVIRUS 2 (TAT 6-24 HRS) Nasopharyngeal Nasopharyngeal Swab     Status: None   Collection Time: 02/16/19  1:15 PM   Specimen: Nasopharyngeal Swab  Result Value Ref Range Status   SARS Coronavirus 2 NEGATIVE NEGATIVE Final    Comment: (NOTE) SARS-CoV-2 target nucleic acids are NOT DETECTED. The SARS-CoV-2 RNA is generally detectable in upper and lower respiratory specimens during the acute phase of infection. Negative  results do not preclude SARS-CoV-2 infection, do not rule out co-infections with other pathogens, and should not be used as the sole basis for treatment or other patient management decisions. Negative results must be combined with clinical observations, patient history, and epidemiological information. The expected result is Negative. Fact Sheet for Patients: HairSlick.no Fact Sheet for Healthcare Providers: quierodirigir.com This test is not yet approved or cleared by the [place] FDA and  has been authorized for detection and/or diagnosis of  SARS-CoV-2 by FDA under an Emergency Use Authorization (EUA). This EUA will remain  in effect (meaning this test can be used) for the duration of the COVID-19 declaration under Section 56 4(b)(1) of the Act, 21 U.S.C. section 360bbb-3(b)(1), unless the authorization is terminated or revoked sooner. Performed at [facility], [address] [place], [place] [address]   MRSA PCR Screening     Status: None   Collection Time: 02/16/19  4:56 PM   Specimen: Nasal Mucosa; Nasopharyngeal  Result Value Ref Range Status   MRSA by PCR NEGATIVE NEGATIVE Final    Comment:        The GeneXpert MRSA Assay (FDA approved for NASAL specimens only), is one component of a comprehensive MRSA colonization surveillance program. It is not intended to diagnose MRSA infection nor to guide or monitor treatment for MRSA infections. Performed at [facility], [address] [place], [place] [address]      Labs: BNP (last 3 results) No results for input(s): BNP in the last 8760 hours. Basic Metabolic Panel: Recent Labs  Lab 02/16/19 1904 02/16/19 2303 02/17/19 0212 02/17/19 0630 02/17/19 1026  NA 134* 132* 134* 135 135  K 4.4 4.0 3.6 3.7 4.1  CL 103 103 106 105 104  CO2 19* 15* 18* 18* 20*  GLUCOSE 150* 198* 152* 145* 228*  BUN CREATININE 0.67 0.74 0.64 0.65 0.69  CALCIUM 8.6* 8.4* 8.7* 8.8* 9.0   Liver Function Tests: Recent Labs  Lab 02/16/19 1140  AST 18  ALT 16  ALKPHOS 112  BILITOT 1.4*  PROT 8.0  ALBUMIN 4.7   Recent Labs  Lab 02/16/19 1140  LIPASE 17   No results for input(s): AMMONIA in the last 168 hours. CBC: Recent Labs  Lab 02/16/19 1140 02/16/19 1311 02/17/19 0212  WBC 10.5  --  6.0  HGB 13.8 15.6 12.0*  HCT 42.7 46.0 37.4*  MCV 86.8  --  87.4  PLT 332  --  262   Cardiac Enzymes: No results for input(s): CKTOTAL, CKMB, CKMBINDEX, TROPONINI in the last 168 hours. BNP: Invalid input(s): POCBNP CBG: Recent Labs    Lab 02/17/19 0804 02/17/19 0858 02/17/19 1017 02/17/19 1103 02/17/19 1159  GLUCAP 170* 230* 209* 202* 209*   D-Dimer Recent Labs    02/16/19 1657  DDIMER 1.26*   Hgb A1c Recent Labs    02/17/19 0600  HGBA1C 9.2*   Lipid Profile No results for input(s): CHOL, HDL, LDLCALC, TRIG, CHOLHDL, LDLDIRECT in the last 72 hours. Thyroid function studies No results for input(s): TSH, T4TOTAL, T3FREE, THYROIDAB in the last 72 hours.  Invalid input(s): FREET3 Anemia work up No results for input(s): VITAMINB12, FOLATE, FERRITIN, TIBC, IRON, RETICCTPCT in the last 72 hours. Urinalysis    Component Value Date/Time   COLORURINE STRAW (A) 02/16/2019 1140   APPEARANCEUR CLEAR 02/16/2019 1140   LABSPEC 1.020 02/16/2019 1140   PHURINE 5.0 02/16/2019 1140   GLUCOSEU >=500 (A) 02/16/2019 1140  HGBUR NEGATIVE 02/16/2019 1140   BILIRUBINUR NEGATIVE 02/16/2019 1140   KETONESUR 80 (A) 02/16/2019 1140   PROTEINUR NEGATIVE 02/16/2019 1140   NITRITE NEGATIVE 02/16/2019 1140   LEUKOCYTESUR NEGATIVE 02/16/2019 1140   Sepsis Labs Invalid input(s): PROCALCITONIN,  WBC,  LACTICIDVEN  FURTHER DISCHARGE INSTRUCTIONS:   Get Medicines reviewed and adjusted: Please take all your medications with you for your next visit with your Primary MD   Laboratory/radiological data: Please request your Primary MD to go over all hospital tests and procedure/radiological results at the follow up, please ask your Primary MD to get all Hospital records sent to his/her office.   In some cases, they will be blood work, cultures and biopsy results pending at the time of your discharge. Please request that your primary care M.D. goes through all the records of your hospital data and follows up on these results.   Also Note the following: If you experience worsening of your admission symptoms, develop shortness of breath, life threatening emergency, suicidal or homicidal thoughts you must seek medical attention  immediately by calling 911 or calling your MD immediately  if symptoms less severe.   You must read complete instructions/literature along with all the possible adverse reactions/side effects for all the Medicines you take and that have been prescribed to you. Take any new Medicines after you have completely understood and accpet all the possible adverse reactions/side effects.    Do not drive when taking Pain medications or sleeping medications (Benzodaizepines)   Do not take more than prescribed Pain, Sleep and Anxiety Medications. It is not advisable to combine anxiety,sleep and pain medications without talking with your primary care practitioner   Special Instructions: If you have smoked or chewed Tobacco  in the last 2 yrs please stop smoking, stop any regular Alcohol  and or any Recreational drug use.   Wear Seat belts while driving.   Please note: You were cared for by a hospitalist during your hospital stay. Once you are discharged, your primary care physician will handle any further medical issues. Please note that NO REFILLS for any discharge medications will be authorized once you are discharged, as it is imperative that you return to your primary care physician (or establish a relationship with a primary care physician if you do not have one) for your post hospital discharge needs so that they can reassess your need for medications and monitor your lab values.  Time coordinating discharge: 25 minutes  SIGNED:  Pamella Pert, MD, PhD 02/17/2019, 12:54 PM

## 2019-02-17 NOTE — Plan of Care (Signed)

## 2019-02-17 NOTE — Progress Notes (Signed)
Most recent BMP is back.  CO2 remains low at 15.  Anion gap has closed.  Glucose is 198.  Will continue Glucostabilizer protocol for now.   Carlyon Shadow, M.D.

## 2019-07-16 ENCOUNTER — Encounter (HOSPITAL_BASED_OUTPATIENT_CLINIC_OR_DEPARTMENT_OTHER): Payer: Self-pay | Admitting: Emergency Medicine

## 2019-07-16 ENCOUNTER — Other Ambulatory Visit: Payer: Self-pay

## 2019-07-16 ENCOUNTER — Inpatient Hospital Stay (HOSPITAL_BASED_OUTPATIENT_CLINIC_OR_DEPARTMENT_OTHER)
Admission: EM | Admit: 2019-07-16 | Discharge: 2019-07-17 | DRG: 639 | Disposition: A | Discharge status: Home / Self Care | Payer: BLUE CROSS/BLUE SHIELD | Attending: Family Medicine | Admitting: Family Medicine

## 2019-07-16 DIAGNOSIS — F17210 Nicotine dependence, cigarettes, uncomplicated: Secondary | ICD-10-CM | POA: Diagnosis present

## 2019-07-16 DIAGNOSIS — Z8249 Family history of ischemic heart disease and other diseases of the circulatory system: Secondary | ICD-10-CM

## 2019-07-16 DIAGNOSIS — F909 Attention-deficit hyperactivity disorder, unspecified type: Secondary | ICD-10-CM | POA: Diagnosis present

## 2019-07-16 DIAGNOSIS — Z79899 Other long term (current) drug therapy: Secondary | ICD-10-CM

## 2019-07-16 DIAGNOSIS — E1010 Type 1 diabetes mellitus with ketoacidosis without coma: Secondary | ICD-10-CM

## 2019-07-16 DIAGNOSIS — E1110 Type 2 diabetes mellitus with ketoacidosis without coma: Principal | ICD-10-CM | POA: Diagnosis present

## 2019-07-16 DIAGNOSIS — Z8719 Personal history of other diseases of the digestive system: Secondary | ICD-10-CM

## 2019-07-16 DIAGNOSIS — F319 Bipolar disorder, unspecified: Secondary | ICD-10-CM | POA: Diagnosis present

## 2019-07-16 DIAGNOSIS — Z20822 Contact with and (suspected) exposure to covid-19: Secondary | ICD-10-CM | POA: Diagnosis present

## 2019-07-16 DIAGNOSIS — Z833 Family history of diabetes mellitus: Secondary | ICD-10-CM

## 2019-07-16 DIAGNOSIS — Z794 Long term (current) use of insulin: Secondary | ICD-10-CM

## 2019-07-16 LAB — CBC
HCT: 44.7 % (ref 39.0–52.0)
Hemoglobin: 15 g/dL (ref 13.0–17.0)
MCH: 28.5 pg (ref 26.0–34.0)
MCHC: 33.6 g/dL (ref 30.0–36.0)
MCV: 84.8 fL (ref 80.0–100.0)
Platelets: 464 10*3/uL — ABNORMAL HIGH (ref 150–400)
RBC: 5.27 MIL/uL (ref 4.22–5.81)
RDW: 12.2 % (ref 11.5–15.5)
WBC: 16.4 10*3/uL — ABNORMAL HIGH (ref 4.0–10.5)
nRBC: 0 % (ref 0.0–0.2)

## 2019-07-16 LAB — COMPREHENSIVE METABOLIC PANEL
ALT: 56 U/L — ABNORMAL HIGH (ref 0–44)
AST: 128 U/L — ABNORMAL HIGH (ref 15–41)
Albumin: 4.5 g/dL (ref 3.5–5.0)
Alkaline Phosphatase: 142 U/L — ABNORMAL HIGH (ref 38–126)
Anion gap: 27 — ABNORMAL HIGH (ref 5–15)
BUN: 20 mg/dL (ref 6–20)
CO2: 13 mmol/L — ABNORMAL LOW (ref 22–32)
Calcium: 9.7 mg/dL (ref 8.9–10.3)
Chloride: 85 mmol/L — ABNORMAL LOW (ref 98–111)
Creatinine, Ser: 0.94 mg/dL (ref 0.61–1.24)
GFR calc Af Amer: >60 mL/min (ref >60)
GFR calc non Af Amer: >60 mL/min (ref >60)
Glucose, Bld: 688 mg/dL (ref 70–99)
Potassium: 6.5 mmol/L (ref 3.5–5.1)
Sodium: 125 mmol/L — ABNORMAL LOW (ref 135–145)
Total Bilirubin: 1.4 mg/dL — ABNORMAL HIGH (ref 0.3–1.2)
Total Protein: 7.5 g/dL (ref 6.5–8.1)

## 2019-07-16 LAB — URINALYSIS, ROUTINE W REFLEX MICROSCOPIC
Bilirubin Urine: NEGATIVE
Glucose, UA: >=500 mg/dL — AB
Hgb urine dipstick: NEGATIVE
Ketones, ur: >80 mg/dL — AB
Leukocytes,Ua: NEGATIVE
Nitrite: NEGATIVE
Protein, ur: NEGATIVE mg/dL
Specific Gravity, Urine: 1.02 (ref 1.005–1.030)
pH: 5.5 (ref 5.0–8.0)

## 2019-07-16 LAB — CBG MONITORING, ED
Glucose-Capillary: >600 mg/dL (ref 70–99)
Glucose-Capillary: >600 mg/dL (ref 70–99)

## 2019-07-16 LAB — URINALYSIS, MICROSCOPIC (REFLEX): WBC, UA: NONE SEEN WBC/hpf (ref 0–5)

## 2019-07-16 LAB — LIPASE, BLOOD: Lipase: 16 U/L (ref 11–51)

## 2019-07-16 MED ORDER — SODIUM CHLORIDE 0.9 % IV SOLN: INTRAVENOUS | Status: DC

## 2019-07-16 MED ORDER — INSULIN REGULAR(HUMAN) IN NACL 100-0.9 UT/100ML-% IV SOLN
INTRAVENOUS | Status: DC
  Administered 2019-07-16: 13 [IU]/h via INTRAVENOUS
  Filled 2019-07-16: qty 100

## 2019-07-16 MED ORDER — DEXTROSE 50 % IV SOLN: 0.0000 mL | INTRAVENOUS | Status: DC | PRN

## 2019-07-16 MED ORDER — ONDANSETRON HCL 4 MG/2ML IJ SOLN
4.0000 mg | Freq: Once | INTRAMUSCULAR | Status: AC | PRN
  Administered 2019-07-16: 4 mg via INTRAVENOUS
  Filled 2019-07-16: qty 2

## 2019-07-16 MED ORDER — FENTANYL CITRATE (PF) 100 MCG/2ML IJ SOLN
50.0000 ug | INTRAMUSCULAR | Status: DC | PRN
  Administered 2019-07-16: 50 ug via INTRAVENOUS
  Filled 2019-07-16: qty 2

## 2019-07-16 MED ORDER — SODIUM CHLORIDE 0.9% FLUSH
3.0000 mL | Freq: Once | INTRAVENOUS | Status: DC
  Filled 2019-07-16: qty 3

## 2019-07-16 MED ORDER — DEXTROSE-NACL 5-0.45 % IV SOLN: INTRAVENOUS | Status: DC

## 2019-07-16 NOTE — ED Triage Notes (Signed)
Pt states he thinks he is in DKA  PT is c/o abd pain, nausea and shortness of breath  Pt states the abd pain started yesterday but the nausea and shortness of breath started today  Pt states his blood sugar at home was in the 300s

## 2019-07-16 NOTE — ED Notes (Signed)
CBG in triage: HI reported to RN Misty Stanley

## 2019-07-16 NOTE — ED Provider Notes (Signed)
[facility] EMERGENCY DEPARTMENT Provider Note   CSN: [id number] Arrival date & time: 07/16/19  2101     History Chief Complaint  Patient presents with  . Abdominal Pain  . Nausea  . Shortness of Breath    [name] Wall is a 29 y.o. male.  Patient is a 29 year old male with past medical history of insulin-dependent diabetes, ADHD, bipolar, and pancreatitis.  He presents here for evaluation of "I am in DKA".  Patient states that he just feels poorly.  He began having nausea and vomiting this afternoon and presents for evaluation of it.  He denies any chest pain or difficulty breathing.  He denies any fevers or chills.  He denies any exposures to ill contacts.  He does tell me that he recently stopped his insulin for approximately 1 day after he ran out.  The history is provided by the patient.       Past Medical History:  Diagnosis Date  . ADD (attention deficit disorder)   . Bipolar disorder (HCC)   . Diabetes mellitus without complication (HCC)   . Pancreatitis     Patient Active Problem List   Diagnosis Date Noted  . DKA (diabetic ketoacidoses) (HCC) 02/16/2019  . Bipolar disorder (HCC)   . ADD (attention deficit disorder)   . Hyponatremia   . Diabetic ketoacidosis without coma associated with type 1 diabetes mellitus (HCC) 02/13/2018  . Transaminitis 02/13/2018  . Alcohol-induced acute pancreatitis 02/13/2018  . Alcohol abuse with physiological dependence (HCC) 02/13/2018  . Metabolic acidosis due to diabetes mellitus (HCC) 02/13/2018  . Diabetes mellitus without complication (HCC) 02/14/2016  . Fracture of fifth metacarpal bone of right hand 07/07/2015    Past Surgical History:  Procedure Laterality Date  . Arm surgery    . FINGER SURGERY         Family History  Problem Relation Age of Onset  . Diabetes Father   . Cancer Other   . CAD Other     Social History   Tobacco Use  . Smoking status: Current Every Day Smoker    Packs/day:  0.50    Types: Cigarettes  . Smokeless tobacco: Former Engineer, water Use Topics  . Alcohol use: Yes  . Drug use: No    Home Medications Prior to Admission medications   Medication Sig Start Date End Date Taking? Authorizing Provider  amphetamine-dextroamphetamine (ADDERALL) 30 MG tablet Take 30 mg by mouth 2 (two) times daily.      [provider]  gabapentin (NEURONTIN) 100 MG capsule Take 100 mg by mouth 3 (three) times daily. 01/23/19   [provider]  glucagon (GLUCAGON EMERGENCY) 1 MG injection Inject 1 mg into the muscle once as needed for up to 1 dose. 02/21/18   Romero Belling, MD  glucose blood (ACCU-CHEK GUIDE) test strip Used to check blood sugars 4x daily. 09/13/17   Romero Belling, MD  insulin aspart (NOVOLOG FLEXPEN) 100 UNIT/ML FlexPen Inject 5 Units into the skin 3 (three) times daily with meals. And pen needles 4/day 02/21/18   Romero Belling, MD  insulin degludec (TRESIBA FLEXTOUCH) 100 UNIT/ML SOPN FlexTouch Pen Inject 0.5 mLs (50 Units total) into the skin daily. 09/10/17   Romero Belling, MD  nicotine (NICODERM CQ - DOSED IN MG/24 HOURS) 21 mg/24hr patch Place 1 patch (21 mg total) onto the skin daily. 02/13/16   Romero Belling, MD  pantoprazole (PROTONIX) 40 MG tablet Take 1 tablet (40 mg total) by mouth daily. Patient not  taking: Reported on 02/16/2019 02/15/18 02/15/19  Charlynne Cousins, MD  QUEtiapine (SEROQUEL) 100 MG tablet Take 150 mg by mouth at bedtime. 06/30/18 02/23/19  [provider]    Allergies    Patient has no known allergies.  Review of Systems   Review of Systems  All other systems reviewed and are negative.   Physical Exam Updated Vital Signs BP (!) 141/90 (BP Location: Right Arm)   Pulse (!) 121   Resp 18   Ht 5\' 10"  (1.778 m)   Wt 90.7 kg   SpO2 100%   BMI 28.70 kg/m   Physical Exam Vitals and nursing note reviewed.  Constitutional:      General: He is not in acute distress.    Appearance: He is  well-developed. He is not diaphoretic.  HENT:     Head: Normocephalic and atraumatic.     Mouth/Throat:     Comments: Mucous membranes are dry. Cardiovascular:     Rate and Rhythm: Regular rhythm. Tachycardia present.     Heart sounds: No murmur. No friction rub.  Pulmonary:     Effort: Pulmonary effort is normal. No respiratory distress.     Breath sounds: Normal breath sounds. No wheezing or rales.  Abdominal:     General: Bowel sounds are normal. There is no distension.     Palpations: Abdomen is soft.     Tenderness: There is generalized abdominal tenderness.     Comments: There is mild generalized abdominal tenderness.  Musculoskeletal:        General: Normal range of motion.     Cervical back: Normal range of motion and neck supple.  Skin:    General: Skin is warm and dry.     Coloration: Skin is pale.  Neurological:     Mental Status: He is alert and oriented to person, place, and time.     Coordination: Coordination normal.     ED Results / Procedures / Treatments   Labs (all labs ordered are listed, but only abnormal results are displayed) Labs Reviewed  CBG MONITORING, ED - Abnormal; Notable for the following components:      Result Value   Glucose-Capillary >600 (*)    All other components within normal limits  LIPASE, BLOOD  COMPREHENSIVE METABOLIC PANEL  CBC  URINALYSIS, ROUTINE W REFLEX MICROSCOPIC    EKG None  Radiology No results found.  Procedures Procedures (including critical care time)  Medications Ordered in ED Medications  sodium chloride flush (NS) 0.9 % injection 3 mL (has no administration in time range)  fentaNYL (SUBLIMAZE) injection 50 mcg (50 mcg Intravenous Given 07/16/19 2206)  ondansetron (ZOFRAN) injection 4 mg (4 mg Intravenous Given 07/16/19 2205)    ED Course  I have reviewed the triage vital signs and the nursing notes.  Pertinent labs & imaging results that were available during my care of the patient were reviewed by me  and considered in my medical decision making (see chart for details).    MDM Rules/Calculators/A&P  Lab work consistent with diabetic ketoacidosis.  IV fluids initiated and patient started on an insulin drip.  Patient to be admitted to the hospitalist service for further management.  CRITICAL CARE Performed by: Veryl Speak Total critical care time: 35 minutes Critical care time was exclusive of separately billable procedures and treating other patients. Critical care was necessary to treat or prevent imminent or life-threatening deterioration. Critical care was time spent personally by me on the following activities: development of treatment plan  with patient and/or surrogate as well as nursing, discussions with consultants, evaluation of patient's response to treatment, examination of patient, obtaining history from patient or surrogate, ordering and performing treatments and interventions, ordering and review of laboratory studies, ordering and review of radiographic studies, pulse oximetry and re-evaluation of patient's condition.   Final Clinical Impression(s) / ED Diagnoses Final diagnoses:  None    Rx / DC Orders ED Discharge Orders    None       Geoffery Lyons, MD 07/16/19 2339

## 2019-07-16 NOTE — ED Notes (Signed)
PT vomited approximately 1600 ml of tan liquid emesis in triage

## 2019-07-17 LAB — CBG MONITORING, ED
Glucose-Capillary: 142 mg/dL — ABNORMAL HIGH (ref 70–99)
Glucose-Capillary: 145 mg/dL — ABNORMAL HIGH (ref 70–99)
Glucose-Capillary: 170 mg/dL — ABNORMAL HIGH (ref 70–99)
Glucose-Capillary: 212 mg/dL — ABNORMAL HIGH (ref 70–99)
Glucose-Capillary: 222 mg/dL — ABNORMAL HIGH (ref 70–99)
Glucose-Capillary: 240 mg/dL — ABNORMAL HIGH (ref 70–99)
Glucose-Capillary: 318 mg/dL — ABNORMAL HIGH (ref 70–99)
Glucose-Capillary: 396 mg/dL — ABNORMAL HIGH (ref 70–99)
Glucose-Capillary: 491 mg/dL — ABNORMAL HIGH (ref 70–99)

## 2019-07-17 LAB — BASIC METABOLIC PANEL
Anion gap: 11 (ref 5–15)
Anion gap: 24 — ABNORMAL HIGH (ref 5–15)
BUN: 21 mg/dL — ABNORMAL HIGH (ref 6–20)
BUN: 22 mg/dL — ABNORMAL HIGH (ref 6–20)
CO2: 11 mmol/L — ABNORMAL LOW (ref 22–32)
CO2: 24 mmol/L (ref 22–32)
Calcium: 8.9 mg/dL (ref 8.9–10.3)
Calcium: 9.4 mg/dL (ref 8.9–10.3)
Chloride: 101 mmol/L (ref 98–111)
Chloride: 95 mmol/L — ABNORMAL LOW (ref 98–111)
Creatinine, Ser: 0.74 mg/dL (ref 0.61–1.24)
Creatinine, Ser: 1.03 mg/dL (ref 0.61–1.24)
GFR calc Af Amer: >60 mL/min (ref >60)
GFR calc Af Amer: >60 mL/min (ref >60)
GFR calc non Af Amer: >60 mL/min (ref >60)
GFR calc non Af Amer: >60 mL/min (ref >60)
Glucose, Bld: 150 mg/dL — ABNORMAL HIGH (ref 70–99)
Glucose, Bld: 473 mg/dL — ABNORMAL HIGH (ref 70–99)
Potassium: 4.5 mmol/L (ref 3.5–5.1)
Potassium: 4.7 mmol/L (ref 3.5–5.1)
Sodium: 130 mmol/L — ABNORMAL LOW (ref 135–145)
Sodium: 136 mmol/L (ref 135–145)

## 2019-07-17 LAB — SARS CORONAVIRUS 2 (TAT 6-24 HRS): SARS Coronavirus 2: NEGATIVE

## 2019-07-17 MED ORDER — INSULIN GLARGINE 100 UNIT/ML ~~LOC~~ SOLN
25.0000 [IU] | SUBCUTANEOUS | Status: DC
  Administered 2019-07-17: 25 [IU] via SUBCUTANEOUS
  Filled 2019-07-17: qty 1

## 2019-07-17 MED ORDER — INSULIN ASPART 100 UNIT/ML ~~LOC~~ SOLN
0.0000 [IU] | Freq: Three times a day (TID) | SUBCUTANEOUS | Status: DC
  Administered 2019-07-17: 1 [IU] via SUBCUTANEOUS
  Filled 2019-07-17: qty 1

## 2019-07-17 MED ORDER — INSULIN ASPART 100 UNIT/ML ~~LOC~~ SOLN: 0.0000 [IU] | Freq: Every day | SUBCUTANEOUS | Status: DC

## 2019-07-17 MED ORDER — SODIUM CHLORIDE 0.9 % IV BOLUS
500.0000 mL | Freq: Once | INTRAVENOUS | Status: AC
  Administered 2019-07-17: 500 mL via INTRAVENOUS

## 2019-07-17 NOTE — ED Provider Notes (Signed)
Pt presented to the ED last night for DKA.  He had run out of his insulin.  He was admitted to Dr. Antionette Char, but there were no beds available, so he stayed here.  Dr. Antionette Char managed his sugar overnight and he has not closed his gap and his blood sugar is good after stopping the insulin drip.  Pt said he did pick up his insulin last night, so has his usual meds.  I spoke with Dr. Karren Burly (triad) who is ok with pt going home as long as he has his insulin and f/u.  Pt has both.  I spoke with pt and he is good with this plan.  He knows to return if worse.  F/u with pcp.   Jacalyn Lefevre, MD 07/17/19 [phone]

## 2019-12-09 DEATH — deceased

## 2020-12-22 IMAGING — CT CT ANGIO CHEST
2 of 6 series · 18 of 46 positions shown · IV contrast (APPLIED)
Comparison: X-ray from earlier in the same day.

CLINICAL DATA: Dyspnea on exertion

EXAM:
CT ANGIOGRAPHY CHEST WITH CONTRAST
TECHNIQUE: Multidetector CT imaging of the chest was performed using the
standard protocol during bolus administration of intravenous
contrast. Multiplanar CT image reconstructions and MIPs were
obtained to evaluate the vascular anatomy.
CONTRAST:  100mL OMNIPAQUE IOHEXOL 350 MG/ML SOLN

[Series 5: thins · axial · 0.69mm/px · z∈[-226,+9]mm · 16 of 259 slices shown]
[im 12/259  lung]
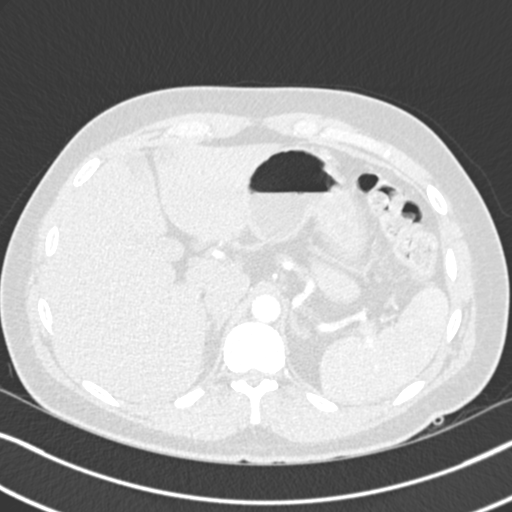
[im 34/259  soft-tissue]
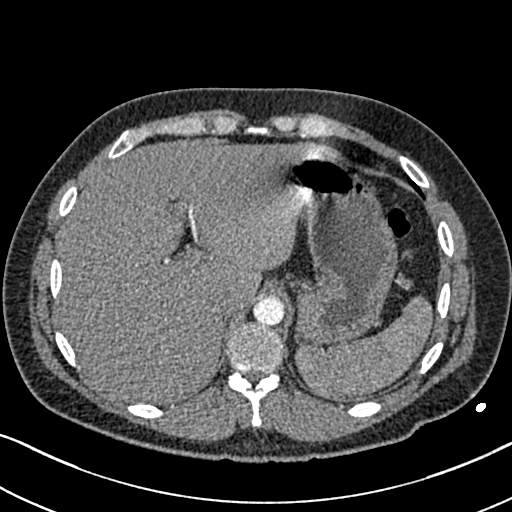
[im 45/259  lung]
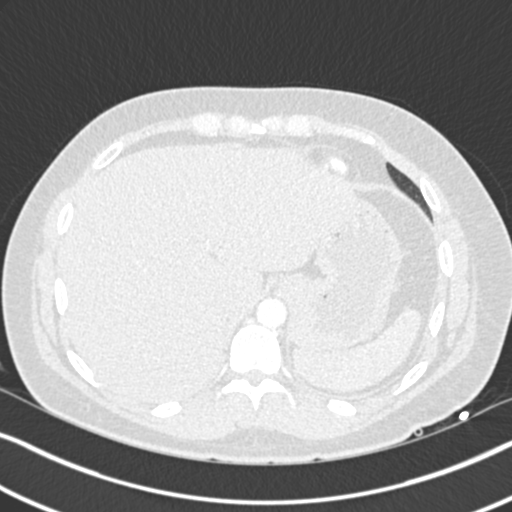
[im 57/259  soft-tissue]
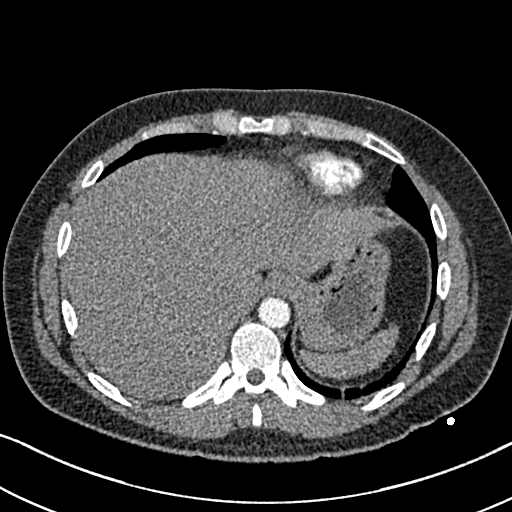
[im 79/259  lung]
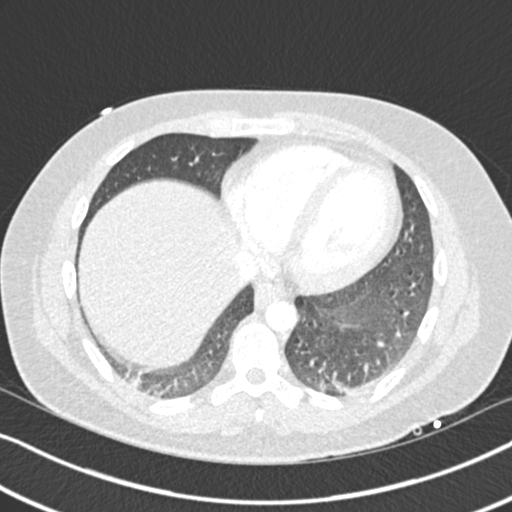
[im 90/259  soft-tissue]
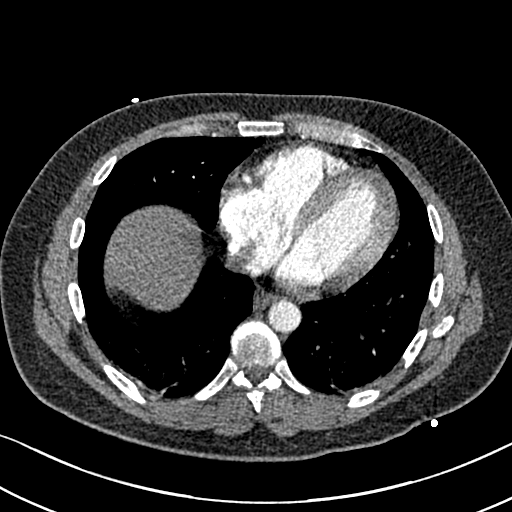
[im 101/259  lung]
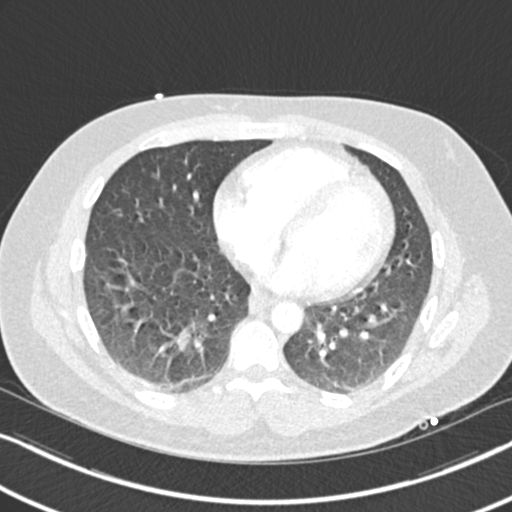
[im 124/259  soft-tissue]
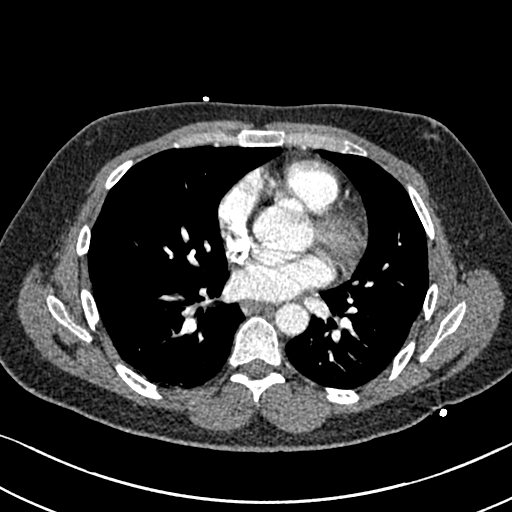
[im 135/259  lung]
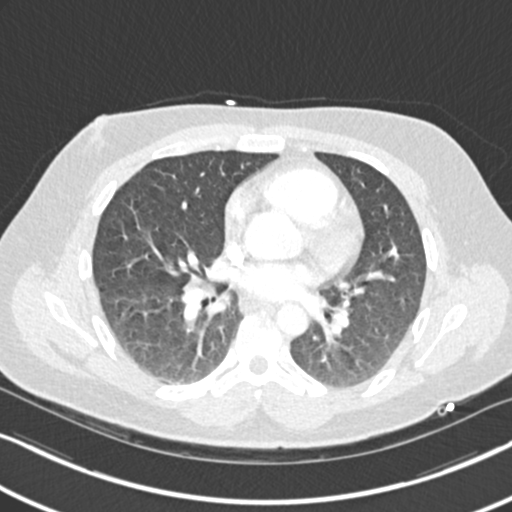
[im 158/259  soft-tissue]
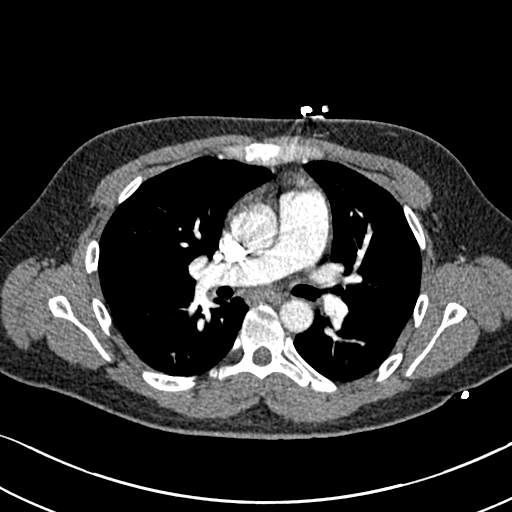
[im 169/259  lung]
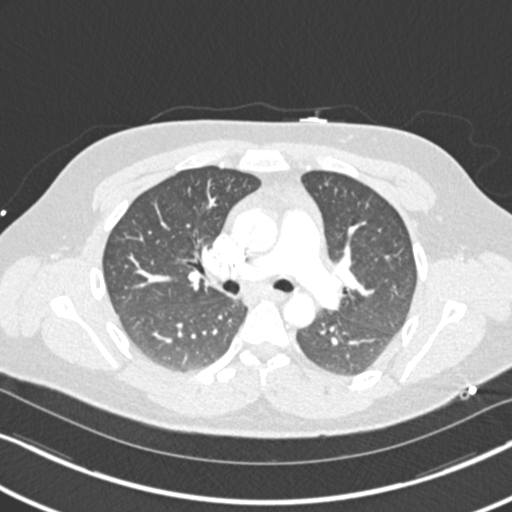
[im 180/259  soft-tissue]
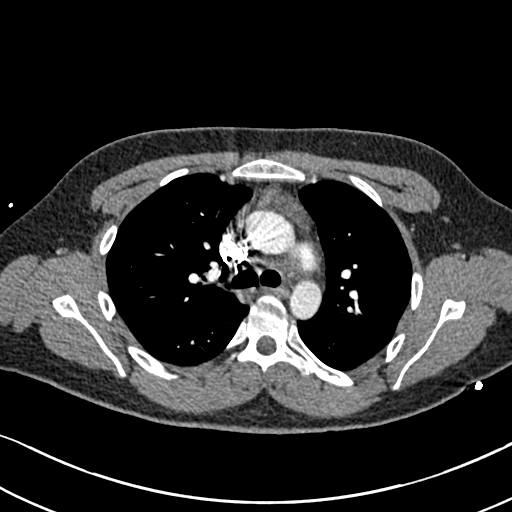
[im 202/259  lung]
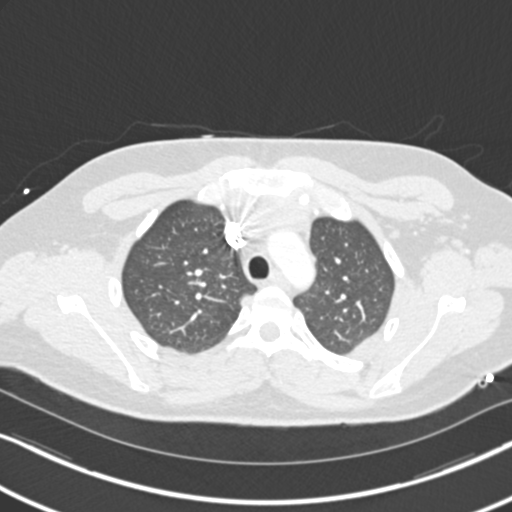
[im 214/259  soft-tissue]
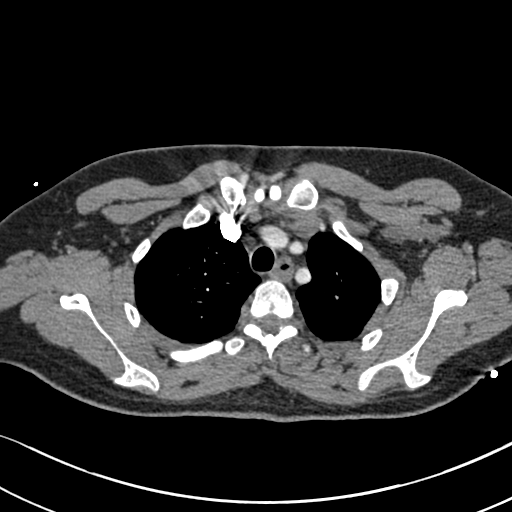
[im 225/259  lung]
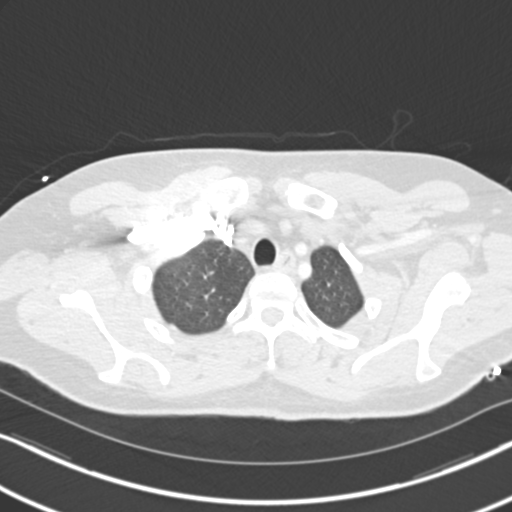
[im 247/259  soft-tissue]
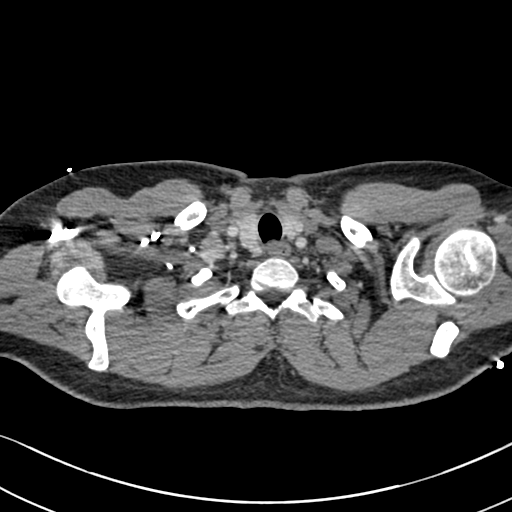

[Series 7: coronal mpr · coronal · 0.51mm/px · 2 of 81 slices shown]
[im 27/81  soft-tissue]
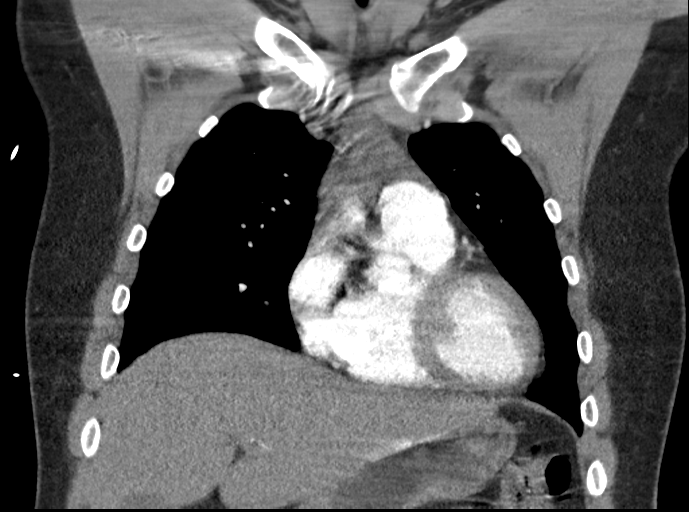
[im 54/81  soft-tissue]
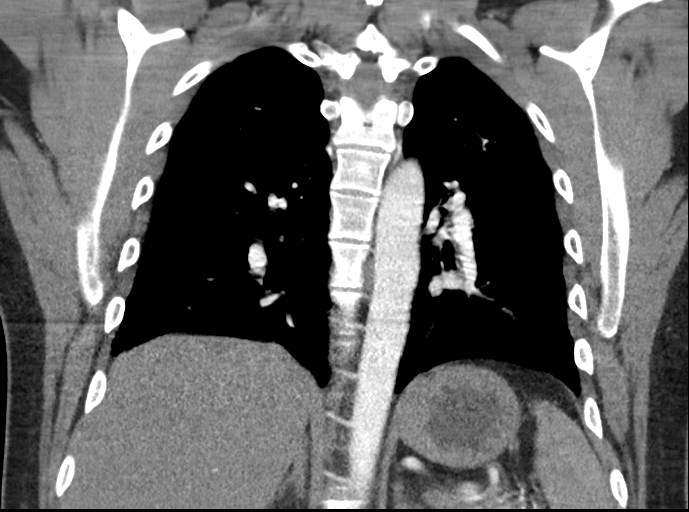

[18 of 46 positions shown; findings below may reference images not displayed]

FINDINGS: Cardiovascular: Thoracic aorta shows no significant aneurysmal
dilatation or dissection. No cardiac enlargement is noted. The
pulmonary artery shows a normal branching pattern. Opacification of
the peripheral most branches is somewhat limited. No large central
pulmonary embolus is noted. No significant coronary calcifications
are seen.

Mediastinum/Nodes: Thoracic inlet is within normal limits. No
sizable hilar or mediastinal adenopathy is noted. The esophagus as
visualized is within normal limits.

Lungs/Pleura: Lungs are well aerated bilaterally. Minimal bibasilar
atelectatic changes are seen. No focal infiltrate or sizable
effusion is noted. No sizable parenchymal nodule is seen.

Upper Abdomen: Visualized upper abdomen shows no acute abnormality.

Musculoskeletal: No acute bony abnormality noted.

Review of the MIP images confirms the above findings.
IMPRESSION: No evidence of pulmonary embolism.

Mild bibasilar atelectatic changes.

No other focal abnormality is seen.
